# Patient Record
Sex: Female | Born: 1945 | Race: White | Hispanic: No | Smoking: Former smoker
Health system: Southern US, Community
[De-identification: ages and names within clinical notes are randomized; demographics above are authoritative.]

## PROBLEM LIST (undated history)

## (undated) DIAGNOSIS — I639 Cerebral infarction, unspecified: Secondary | ICD-10-CM

## (undated) DIAGNOSIS — M1710 Unilateral primary osteoarthritis, unspecified knee: Secondary | ICD-10-CM

## (undated) DIAGNOSIS — K219 Gastro-esophageal reflux disease without esophagitis: Secondary | ICD-10-CM

## (undated) DIAGNOSIS — G459 Transient cerebral ischemic attack, unspecified: Secondary | ICD-10-CM

## (undated) DIAGNOSIS — E782 Mixed hyperlipidemia: Secondary | ICD-10-CM

## (undated) DIAGNOSIS — E785 Hyperlipidemia, unspecified: Secondary | ICD-10-CM

## (undated) DIAGNOSIS — Z8619 Personal history of other infectious and parasitic diseases: Secondary | ICD-10-CM

## (undated) DIAGNOSIS — H25019 Cortical age-related cataract, unspecified eye: Secondary | ICD-10-CM

## (undated) DIAGNOSIS — IMO0002 Reserved for concepts with insufficient information to code with codable children: Secondary | ICD-10-CM

## (undated) DIAGNOSIS — J302 Other seasonal allergic rhinitis: Secondary | ICD-10-CM

## (undated) DIAGNOSIS — I10 Essential (primary) hypertension: Secondary | ICD-10-CM

## (undated) DIAGNOSIS — G5600 Carpal tunnel syndrome, unspecified upper limb: Secondary | ICD-10-CM

## (undated) DIAGNOSIS — D649 Anemia, unspecified: Secondary | ICD-10-CM

## (undated) HISTORY — PX: TOTAL ABDOMINAL HYSTERECTOMY: SHX209

## (undated) HISTORY — PX: EYE SURGERY: SHX253

## (undated) HISTORY — PX: SALPINGOOPHORECTOMY: SHX82

## (undated) HISTORY — PX: CATARACT EXTRACTION: SUR2

---

## 2003-10-30 ENCOUNTER — Ambulatory Visit: Payer: Self-pay | Admitting: Family Medicine

## 2003-12-04 ENCOUNTER — Ambulatory Visit: Payer: Self-pay | Admitting: General Practice

## 2004-06-22 ENCOUNTER — Other Ambulatory Visit: Payer: Self-pay

## 2004-06-23 ENCOUNTER — Ambulatory Visit: Payer: Self-pay | Admitting: Orthopaedic Surgery

## 2004-06-23 HISTORY — PX: KNEE ARTHROSCOPY W/ PARTIAL MEDIAL MENISCECTOMY: SHX1882

## 2004-08-05 ENCOUNTER — Encounter: Payer: Self-pay | Admitting: Orthopaedic Surgery

## 2004-11-17 ENCOUNTER — Ambulatory Visit: Payer: Self-pay | Admitting: Internal Medicine

## 2005-04-14 ENCOUNTER — Ambulatory Visit: Payer: Self-pay | Admitting: Internal Medicine

## 2006-04-25 ENCOUNTER — Ambulatory Visit: Payer: Self-pay | Admitting: Anesthesiology

## 2006-05-18 ENCOUNTER — Ambulatory Visit: Payer: Self-pay | Admitting: Anesthesiology

## 2006-06-20 ENCOUNTER — Ambulatory Visit: Payer: Self-pay | Admitting: Unknown Physician Specialty

## 2006-06-21 ENCOUNTER — Ambulatory Visit: Payer: Self-pay | Admitting: Unknown Physician Specialty

## 2006-07-20 ENCOUNTER — Ambulatory Visit: Payer: Self-pay | Admitting: Unknown Physician Specialty

## 2006-10-18 ENCOUNTER — Ambulatory Visit: Payer: Self-pay | Admitting: Internal Medicine

## 2008-06-27 ENCOUNTER — Ambulatory Visit: Payer: Self-pay

## 2009-09-30 ENCOUNTER — Ambulatory Visit: Payer: Self-pay

## 2010-03-15 ENCOUNTER — Ambulatory Visit: Payer: Self-pay | Admitting: Unknown Physician Specialty

## 2010-06-07 ENCOUNTER — Emergency Department (HOSPITAL_COMMUNITY): Payer: Medicare Other

## 2010-06-07 ENCOUNTER — Inpatient Hospital Stay (HOSPITAL_COMMUNITY)
Admission: EM | Admit: 2010-06-07 | Discharge: 2010-06-09 | DRG: 069 | Disposition: A | Discharge status: Home / Self Care | Payer: Medicare Other | Attending: Internal Medicine | Admitting: Internal Medicine

## 2010-06-07 DIAGNOSIS — E785 Hyperlipidemia, unspecified: Secondary | ICD-10-CM | POA: Diagnosis present

## 2010-06-07 DIAGNOSIS — G459 Transient cerebral ischemic attack, unspecified: Principal | ICD-10-CM | POA: Diagnosis present

## 2010-06-07 DIAGNOSIS — Z79899 Other long term (current) drug therapy: Secondary | ICD-10-CM

## 2010-06-07 DIAGNOSIS — M1710 Unilateral primary osteoarthritis, unspecified knee: Secondary | ICD-10-CM | POA: Diagnosis present

## 2010-06-07 DIAGNOSIS — I10 Essential (primary) hypertension: Secondary | ICD-10-CM | POA: Diagnosis present

## 2010-06-07 DIAGNOSIS — K219 Gastro-esophageal reflux disease without esophagitis: Secondary | ICD-10-CM | POA: Diagnosis present

## 2010-06-07 DIAGNOSIS — I6529 Occlusion and stenosis of unspecified carotid artery: Secondary | ICD-10-CM | POA: Diagnosis present

## 2010-06-07 DIAGNOSIS — Z780 Asymptomatic menopausal state: Secondary | ICD-10-CM

## 2010-06-07 DIAGNOSIS — Z79890 Hormone replacement therapy (postmenopausal): Secondary | ICD-10-CM

## 2010-06-07 LAB — POCT I-STAT, CHEM 8
BUN: 12 mg/dL (ref 6–23)
Calcium, Ion: 1.12 mmol/L (ref 1.12–1.32)
Chloride: 100 mEq/L (ref 96–112)
HCT: 40 % (ref 36.0–46.0)
Potassium: 4.1 mEq/L (ref 3.5–5.1)
Sodium: 134 mEq/L — ABNORMAL LOW (ref 135–145)

## 2010-06-07 LAB — URINALYSIS, ROUTINE W REFLEX MICROSCOPIC
Glucose, UA: NEGATIVE mg/dL
Hgb urine dipstick: NEGATIVE
Protein, ur: NEGATIVE mg/dL
Specific Gravity, Urine: 1.012 (ref 1.005–1.030)
Urobilinogen, UA: 0.2 mg/dL (ref 0.0–1.0)

## 2010-06-07 LAB — APTT: aPTT: 25 seconds (ref 24–37)

## 2010-06-07 LAB — PROTIME-INR
INR: 0.9 (ref 0.00–1.49)
Prothrombin Time: 12.4 seconds (ref 11.6–15.2)

## 2010-06-07 LAB — COMPREHENSIVE METABOLIC PANEL
AST: 25 U/L (ref 0–37)
Albumin: 3.3 g/dL — ABNORMAL LOW (ref 3.5–5.2)
Alkaline Phosphatase: 70 U/L (ref 39–117)
Chloride: 96 mEq/L (ref 96–112)
GFR calc Af Amer: >60 mL/min (ref >60)
Potassium: 4 mEq/L (ref 3.5–5.1)
Total Bilirubin: 0.3 mg/dL (ref 0.3–1.2)
Total Protein: 6.5 g/dL (ref 6.0–8.3)

## 2010-06-07 LAB — TSH: TSH: 1.397 u[IU]/mL (ref 0.350–4.500)

## 2010-06-07 LAB — PHOSPHORUS: Phosphorus: 3 mg/dL (ref 2.3–4.6)

## 2010-06-07 LAB — GLUCOSE, CAPILLARY: Glucose-Capillary: 117 mg/dL — ABNORMAL HIGH (ref 70–99)

## 2010-06-07 LAB — CBC
Platelets: 317 10*3/uL (ref 150–400)
RBC: 4.32 MIL/uL (ref 3.87–5.11)
RDW: 12.5 % (ref 11.5–15.5)
WBC: 6.1 10*3/uL (ref 4.0–10.5)

## 2010-06-07 LAB — MAGNESIUM: Magnesium: 1.9 mg/dL (ref 1.5–2.5)

## 2010-06-07 LAB — DIFFERENTIAL
Basophils Absolute: 0 10*3/uL (ref 0.0–0.1)
Basophils Relative: 1 % (ref 0–1)
Eosinophils Absolute: 0.2 10*3/uL (ref 0.0–0.7)
Eosinophils Relative: 3 % (ref 0–5)
Neutrophils Relative %: 45 % (ref 43–77)

## 2010-06-07 LAB — CK TOTAL AND CKMB (NOT AT ARMC)
CK, MB: 2.3 ng/mL (ref 0.3–4.0)
Total CK: 58 U/L (ref 7–177)

## 2010-06-08 LAB — URINE CULTURE
Colony Count: 15000
Culture  Setup Time: 201206031501

## 2010-06-08 LAB — CBC
HCT: 37.4 % (ref 36.0–46.0)
Hemoglobin: 12.5 g/dL (ref 12.0–15.0)
MCH: 28.6 pg (ref 26.0–34.0)
MCHC: 33.4 g/dL (ref 30.0–36.0)
RDW: 12.6 % (ref 11.5–15.5)

## 2010-06-08 LAB — BASIC METABOLIC PANEL
BUN: 9 mg/dL (ref 6–23)
CO2: 23 mEq/L (ref 19–32)
Calcium: 9.3 mg/dL (ref 8.4–10.5)
Creatinine, Ser: 0.55 mg/dL (ref 0.4–1.2)
GFR calc non Af Amer: >60 mL/min (ref >60)
Glucose, Bld: 161 mg/dL — ABNORMAL HIGH (ref 70–99)

## 2010-06-08 LAB — LIPID PANEL
Triglycerides: 230 mg/dL — ABNORMAL HIGH (ref <150)
VLDL: 46 mg/dL — ABNORMAL HIGH (ref 0–40)

## 2010-06-08 NOTE — Consult Note (Signed)
  NAMEKIRRAH, Marilyn [name] NO.:  [id number]  MEDICAL RECORD NO.:  [id number]  LOCATION:                                 FACILITY:  PHYSICIAN:  Levie Heritage, MD       DATE OF BIRTH:  [birth year]  DATE OF CONSULTATION:  06/07/2010 DATE OF DISCHARGE:                                CONSULTATION   REFERRING PHYSICIAN:  ER Team.  REASON FOR CONSULTATION:  Code stroke.  CHIEF COMPLAINT:  Sudden heaviness feeling of the left arm and leg.  HISTORY OF PRESENT ILLNESS:  This patient is a 65 year old woman who woke up this morning and went to restroom and while coming back she realized that she has been feeling heavy on the left arm and leg with altered sensation; however, she is affirmative that she could still move her left arm and leg appropriately, although it felt funny and heavy. She denies any changes in her vision.  Denies any change in her strength.  Denies any other neurological deficits.  She was brought up by the EMS and blood pressure in the field was noted to be in 200 systolic ranges.  Her symptoms resolved almost completely back to baseline while she was in CT scanner around 9:25 a.m.  The NIH stroke scale performed at that time is 0.  PAST MEDICAL HISTORY:  The patient is known to have the hypertension, hyperlipidemia, hysterectomy.  MEDICATIONS:  She takes generic for Cozaar 50 mg twice a day.  She takes Premarin 0.3 mg daily.  She takes Singulair 10 mg daily.  ALLERGIES:  She is allergic to PENICILLIN and SULFA DRUGS which cause rash.  SOCIAL HISTORY:  She is married and has one child.  She is a Buyer, retail.  She denies any tobacco, alcohol, or illicit drug abuse.  FAMILY HISTORY:  Father died in his 28s with stroke.  Mother had multiple TIAs but died of old age.  Dictated ended at this point.         ______________________________ Levie Heritage, MD    WS/MEDQ  D:  06/07/2010  T:  06/07/2010  Job:   [id number]  Electronically Signed by Levie Heritage MD on 06/08/2010 10:21:03 PM

## 2010-06-08 NOTE — Consult Note (Signed)
Marilyn [name], VALLELY                ACCOUNT NO.:  [id number]  MEDICAL RECORD NO.:  [id number]           PATIENT TYPE:  I  LOCATION:  3001                         FACILITY:  [facility]  PHYSICIAN:  Levie Heritage, MD       DATE OF BIRTH:  09/08/1945  DATE OF CONSULTATION:  06/07/2010 DATE OF DISCHARGE:                                CONSULTATION   REASON FOR CONSULTATION:  Code stroke.  REFERRING PHYSICIAN:  Emergency Department Team.  CHIEF COMPLAINT:  Sudden heaviness feeling of the left arm and leg.  HISTORY OF PRESENT ILLNESS:  This patient is a 65 year old woman who woke up normal this morning and around 8 and 8:15 a.m. she noted sudden left arm and left leg heaviness feeling.  She is affirmative that she could still move her left arm and leg appropriately.  However, it felt feeling heavy.  She denies any changes in her vision.  Denies any similar features in her face.  Denies any motor weakness otherwise.  She was taken to the hospital emergency by the EMS who reported having a systolic blood pressure in 200 ranges in field coming back.  While she was in the CT scanner, her symptoms completely resolved and NIH stroke scale at that time was 0.  PAST MEDICAL HISTORY:  Hysterectomy, hypertension, hyperlipidemia.  MEDICATIONS:  She takes; 1. Generic Cozaar 50 mg twice a day. 2. Premarin 0.3 mg daily for hot flashes. 3. Singulair 10 mg daily.  ALLERGIES:  She is allergic to PENICILLIN and SULFA drug which causes rash.  SOCIAL HISTORY:  She is married, has 1 child.  She is driver of a public transportation.  She denies any tobacco, alcohol or illicit drug abuse.  FAMILY HISTORY:  Father died of stroke in his 19s.  Mother had multiple TIAs.  REVIEW OF SYSTEM:  Denies any chest pain.  Denies any shortness of breath.  Denies any changes in her vision.  No recent rashes.  No recent fevers.  No recent weight loss.  No nausea, vomiting, diarrhea.  No burning urination.  No bowel or  bladder control problem.  No more motor weakness.  No sensory issues anymore.  No visual changes  Rest of 10- organ review of system unremarkable.  REVIEW OF CLINICAL DATA:  I have seen her CT scan in the emergency, although official report is still pending but I do notice rather subacute low-density area in the right anterior aspects of the basal ganglia.  It is not bigger than the lacune size.  Other than that I do not see any abnormality. Her stroke panel is unremarkable.  Her Chem-8 is showing mildly elevated glucose, but otherwise unremarkable.  PHYSICAL EXAMINATION:  GENERAL:  Currently lying down comfortably on the bed.  Awake, oriented x3 in no acute distress. VITAL SIGNS:  Blood pressure is 184/100 mmHg, pulse of 89 per minute. HEENT:  Bilateral pupils are reactive to light and accommodation.  No visual field cut.  Moves eyes to all direction.  Symmetrical facial sensation and strength testing.  Midline tongue without atrophy or fasciculation.  Motor examination strength reveals 5/5 all over  without any drift. Sensory examination, admits to light touch symmetrically normal all over. NIH stroke scale currently is 0.  IMPRESSION:  This patient is a 65 year old woman with sudden onset of the heavy feeling on the left arm and leg and the CT scan is showing low- density area on the left posterior frontal region.  I think its likley that  she does have a lacunar stroke in the left anterior basal ganglia causing her symptoms which are already resolving. This could also endup with a TIA if she  comes back completely at her baseline. However, her NIH stroke scale is 0 currently and that makes her not a candidate for TPA at this point.  PLAN: 1. Please give the patient aspirin 325 mg daily basis on first dose     now. 2. Get the MRI and MRA of the brain. 3. Doppler carotid, cardiac, echo, lipid profile in the morning     fasting labs. 4. Stroke Team will follow the patient up  from morning and will     proceed accordingly to the case progresses.          ______________________________ Levie Heritage, MD     WS/MEDQ  D:  06/07/2010  T:  06/07/2010  Job:  [id number]  Electronically Signed by Levie Heritage MD on 06/08/2010 08:15:38 AM

## 2010-06-09 NOTE — Discharge Summary (Signed)
NAMETABOR, BARTRAM NO.:  [id number]  MEDICAL RECORD NO.:  [id number]  LOCATION:  3001                         FACILITY:  [facility]  PHYSICIAN:  Talmage Nap, MD  DATE OF BIRTH:  26-Nov-1945  DATE OF ADMISSION:  06/07/2010 DATE OF DISCHARGE:  06/09/2010                        DISCHARGE SUMMARY - REFERRING   PRIMARY CARE PHYSICIAN:  Dr. Susette Racer, [place].  CONSULTANT INVOLVED IN THE CASE:  Neurology at [facility], Levie Heritage, MD  DISCHARGE DIAGNOSES: 1. Left-sided weakness, resolved, questionable transient ischemic     attack. 2. Hypertension. 3. Carotid artery disease - surveillance q.6-12 monthly. 4. Hyperlipidemia. 5. History of osteoarthritis. 6. Gastroesophageal reflux disease.  The patient is a 65 year old Caucasian female with history of GERD and hypertension, who was admitted to the hospital on June 07, 2010, by Dr. Rosanna Randy with complaint of left-sided weakness and numbness which started when the patient woke up from bed.  She says she was unable to move her left side and was dragging her left side.  She denied any history of blurry vision.  She says she had mild slurred speech, but no nausea, no vomiting, no fever, no chills, no rigor and subsequently was brought to the emergency room.  In the ED, code stroke was activated.  However, examination of the patient showed that symptoms had resolved.  The patient was subsequently admitted for the workup.  PAST SURGICAL HISTORY:  Hysterectomy.  PREADMISSION MEDICATIONS: 1. Premarin. 2. Singulair. 3. Diclofenac. 4. Nexium.  SOCIAL HISTORY:  Negative for alcohol or tobacco use.  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  FAMILY HISTORY:  Noncontributory.  REVIEW OF SYSTEMS:  Essentially documented in the initial history and physical.  PHYSICAL EXAMINATION:  VITAL SIGNS:  At the time the patient was seen by the admitting physician, her vital  signs; blood pressure was 184/100, respiratory rate 20, heart rate was 82, temperature was 98.5, and she was sating to be saturating 98% on room air. HEENT:  Pupils were reactive to light and extraocular muscles intact. NECK:  No jugular venous distention.  No carotid bruit.  No lymphadenopathy. CHEST:  Clear to auscultation. HEART:  Heart sounds are 1 and 2. ABDOMEN:  Soft, nontender.  Liver, spleen, kidney not palpable.  Bowel sounds are positive. EXTREMITIES:  No pedal edema. NEUROLOGIC:  Nonfocal. MUSCULOSKELETAL SYSTEM:  Unremarkable. SKIN:  Normal turgor.  LABORATORY DATA:  Chem-8 STAT showed a sodium of 134, potassium of 4.1, chloride of 102, bicarb of 114, BUN is 12, creatinine is 0.80. Hematological indices showed WBC of 6.1, hemoglobin of 12.4, hematocrit of 36.6, MCV of 84.7, platelet count of 317 with normal differentials. Urinalysis unremarkable.  Magnesium level is 1.9, phosphorus level 3.0. Hemoglobin A1c is 5.7.  TSH 1.397, and a complete blood count with no differential showed WBC of 7.3, hemoglobin of 12.5, hematocrit of 37.4, MCV of 85.6 with a platelet count of 344.  A repeat basic metabolic panel done on June 08, 2010, showed sodium of 130, potassium of 3.9, chloride of 93 with a bicarb of 23, glucose is 161, BUN is 9, creatinine 0.55.  Lipid panel showed total cholesterol 6.8, triglyceride 230,  HDL cholesterol 61, LDL cholesterol 161.  Urine culture negative.  Imaging studies done on the patient include CT of the head without contrast showed subacute chronic lacunar infarct in the right frontal lobe.  There is chronic small vessel disease.  MRI of the head without contrast showed no acute intracranial abnormality.  There is moderate for age chronic small vessel disease.  An MRA of the head showed no proximal stenosis or major intracranial branch occlusion.  There is mild atherosclerosis involving the anterior circulation.  Carotid duplex showed no significant  stenosis of the right ICA, but there is 60-79 stenosis of the left ICA.  A 2-D echo done showed normal left ventricular cavity, EF of 60%-65% with a PA pressure of 39 mmHg.  HOSPITAL COURSE:  The patient was admitted to Neuro Tele.  She was started on aspirin 325 mg p.o. daily, Premarin 0.3 mg p.o. daily, Singulair 10 mg p.o. daily, and diclofenac 75 mg p.o. b.i.d.  The patient was also started on normal saline IV to go at a rate of 100 mL an hour x8 hours and subsequently [facility].  She was given lisinopril 20 mg p.o. daily and Singulair 10 mg p.o. daily.  Also added to the patient's regimen was Zocor 20 mg p.o. daily.  The patient was evaluated by the in- house neurologist who basically concurred with the initial line of management.  Added to the patient's regimen was Zolpidem 5 mg p.o. at bedtime for insomnia.  The patient was, however, seen by me for the very first time in this admission on June 08, 2010, and during this encounter, examination was unremarkable and her vital signs were stable.  She was reevaluated by me today.  Examination was unremarkable.  Her vital signs; blood pressure 158/73, temperature is 98.1, pulse 79, respiratory rate 18, medically stable.  No neurological deficit.  Plan is for the patient to be discharged home today on activity as tolerated, low- sodium, low-cholesterol diet.  She will follow up with her primary care physician in 1-2 weeks and she was also advised to have a surveillance carotid duplex done every 6 months to 12 months.  Medications to be taken at home will include: 1. Aspirin 325 mg 1 p.o. daily. 2. Simvastatin 20 mg 1 p.o. daily. 3. Lopressor 50 mg p.o. b.i.d. 4. Budesonide 32 mcg 2 sprays daily. 5. Losartan 100 mg half a tablet p.o. b.i.d. 6. Multivitamin 1 p.o. daily. 7. Pantoprazole 40 mg 1 p.o. daily. 8. Premarin (conjugated estrogen USP) 0.3 mg 1 p.o. daily. 9. Singulair (montelukast) 10 mg 1 p.o. daily. 10.Voltaren (diclofenac sodium)  1 tablet p.o. b.i.d.     Talmage Nap, MD     CN/MEDQ  D:  06/09/2010  T:  06/09/2010  Job:  [id number]  cc:   Dr. Susette Racer  Electronically Signed by Talmage Nap  on 06/09/2010 05:17:29 PM

## 2010-06-25 NOTE — H&P (Signed)
Marilyn [name] NO.:  [id number]  MEDICAL RECORD NO.:  [id number]           PATIENT TYPE:  E  LOCATION:  [facility]                         FACILITY:  [facility]  PHYSICIAN:  Marilyn [name], MDDATE OF BIRTH:  April 01, 1945  DATE OF ADMISSION:  06/07/2010 DATE OF DISCHARGE:                             HISTORY & PHYSICAL   CHIEF COMPLAINT:  Left-sided weakness and numbness.  HISTORY OF PRESENT ILLNESS:  Ms. Marilyn [name] is a 65 year old female with past medical history significant for hypertension, gastroesophageal reflux disease, allergies, osteoarthritis, and a remote history of costochondritis, who came into the hospital complaining of left-sided weakness and numbness.  The patient reports that this morning when she woke up, she initially saw that she was having cramping on her left lower extremity.  At that moment, she tried to get out of bed and she was unable to really move her left side.  She was dragging her complete left side to the point that she actually stumbled and fall down with really hurting herself.  At that moment, she called her husband who talked with her noticing that it was a really mild slurred speech but not significant and due to the symptoms of being unable to move her left, called 911 and the patient was brought in for further evaluation and treatment.  In the emergency department, Code Stroke was activated. The patient had MRI, MRA, and also a CT scan of the head.  All of them unrevealing of any acute stroke and by the time that the MRI was finished, the patient's symptoms completely resolved making this whole episode appears to be as a TIA.  Of note, when the patient arrived to the emergency department, her blood pressure was 200 systolic and throughout the whole evaluation, it was 180 systolic over 100 diastolic. At that moment, Code Stroke was cancelled, [facility] was called to admit the patient in order to finish TIA workup and  provide further evaluation and treatment.  ALLERGIES:  The patient denies any known drug allergies.  PAST MEDICAL HISTORY:  Significant for gastroesophageal reflux disease, allergies, left knee pain secondary to osteoarthritis, hypertension, history of costochondritis.  The patient is postmenopausal, on HRT.  MEDICATIONS:  Medication reconciliation form has not been done at the moment of this dictation, but per the patient she uses Premarin, Singulair, diclofenac twice a day.  She uses Nexium on daily basis and she cannot remember what is the name of the medications that she use for her blood pressure.  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  She lives with her husband in [place].  Denies any tobacco, any alcohol, or any illicit drug use.  FAMILY HISTORY:  Noncontributory.  REVIEW OF SYSTEMS:  Essentially negative except as specified on the HPI. Denying chest pain, shortness of breath, nausea, vomiting, hematochezia, hematemesis, blurred vision, or any other complaints.  PHYSICAL EXAM:  VITAL SIGNS:  The patient's blood pressure 184/100, respiratory rate 20, heart rate 82, temperature 98.5, oxygen saturation 98% on room air. GENERAL:  The patient was lying in bed, in  no acute distress, alert, awake and oriented x3, cooperative to examination. HEENT:  No trauma.  Normocephalic.  Eyes, PERRLA.  Extraocular muscles intact.  No icterus.  No nystagmus.  Ears, nose, and throat completely normal without any exudates or any erythema inside her throat and no drainage or discharges from her ears and nose.  There is good dentition. Moist mucous membranes. NECK:  Supple.  No thyromegaly.  No bruits. RESPIRATORY:  Clear to auscultation bilaterally. HEART:  Regular rate and rhythm.  No murmurs, gallops, or rubs. ABDOMEN:  Soft, nontender, nondistended.  Positive bowel sounds. EXTREMITIES:  No cyanosis.  No  edema.  No clubbing. SKIN:  No rash.  No petechiae. NEUROLOGIC:  The patient was alert, awake and oriented x3.  Cranial nerves II through XII completely intact.  There is no uvula or tongue deviation.  Muscle strength 5/5 bilaterally symmetrically.  There is preserved intact light touch and pinprick sensation.  Intact fine motors movements.  Normal finger-to-nose and also heel-to-shin.  Negative Romberg.  Her gait was completely normal.  PERTINENT LABORATORY DATA:  The patient had an i-STAT chemistry that showed a bicarb of 25 with ionized calcium of 1.12, hemoglobin 13.6, sodium 134, potassium 4.1, chloride 100, glucose 114, BUN 12, creatinine 0.80, hematocrit was 40.0.  The patient had a stroke panel, which was essentially normal except for an albumin of 3.3.  Urinalysis was unremarkable.  Regarding imaging studies, the patient had a CT of the head without contrast that demonstrated subacute-to-chronic lacunar infarcts in the right frontal lobe, chronic small vessel disease.  MRI and MRA demonstrated no acute intracranial abnormality.  Moderate for age chronic small vessel disease.  No proximal stenosis or major intracranial branch occlusion.  Moderate-to-severe atherosclerosis involving the posterior circulation, especially the bilateral PCA branches.  Mild atherosclerosis involving the anterior circulation.  ASSESSMENT AND PLAN: 1. Left-sided weakness and numbness secondary to transient ischemic     attack.  At this moment, we are going to admit the patient to     complete workup having carotid Doppler, 2-D echo, and also a     stratification lab work looking TSH, fasting lipid profile.  The     patient is going to also have 24 hours observation on telemetry.     Her EKG is normal.  We are going to have a set of cardiac enzymes.     We are going to control the patient's blood pressure and we are     going to start her on aspirin on daily basis and also on statins.     The patient  reports that she was supposed to be taking statin, but     she stopped it because of the concern that she might develop     myalgias. 2. Hypertension.  At this moment, we are going to review the patient's     current home medications.  We are going to restart then leaving     some hypertensive margins due to the fact that we are in front of     an acute transient ischemic attack in order to guarantee blood     perfusion.  We are going to try to get the blood pressure into the     150s range as an outpatient.  We will be looking to have her blood     pressure below 140s.  The patient is going to follow a low-sodium     diet. 3. Gastroesophageal reflux disease.  We are going to  continue PPI. 4. Allergy.  We are going to continue Singulair. 5. Osteoarthritis.  We are going to use Tylenol.  At this moment, we     are going to discontinue the patient's     diclofenac that might be contributing to her hypertension and can     also be contributing to her gastroesophageal reflux.  For DVT     prophylaxis, we are going to start the patient on Lovenox.  Further     treatment are going to depends on the patient's evolution of her     symptoms and also results of the lab work.     Marilyn Randy, MD     CEM/MEDQ  D:  06/07/2010  T:  06/07/2010  Job:  [id number]  cc:   Dr. Daisy Lazar, [facility]  Electronically Signed by Vassie Loll MD on 06/25/2010 10:54:34 AM

## 2011-01-12 ENCOUNTER — Ambulatory Visit: Payer: Self-pay | Admitting: Internal Medicine

## 2011-04-27 ENCOUNTER — Ambulatory Visit: Payer: Self-pay | Admitting: Unknown Physician Specialty

## 2011-07-15 HISTORY — PX: OTHER SURGICAL HISTORY: SHX169

## 2011-08-09 ENCOUNTER — Encounter: Payer: Self-pay | Admitting: Physician Assistant

## 2011-09-05 ENCOUNTER — Encounter: Payer: Self-pay | Admitting: Physician Assistant

## 2011-12-10 ENCOUNTER — Emergency Department: Payer: Self-pay | Admitting: Emergency Medicine

## 2011-12-10 LAB — CBC WITH DIFFERENTIAL/PLATELET
Basophil #: 0.1 10*3/uL (ref 0.0–0.1)
Basophil %: 0.7 %
HCT: 34.3 % — ABNORMAL LOW (ref 35.0–47.0)
HGB: 11.6 g/dL — ABNORMAL LOW (ref 12.0–16.0)
Lymphocyte %: 23.8 %
Monocyte #: 0.8 x10 3/mm (ref 0.2–0.9)
Monocyte %: 8.3 %
Neutrophil #: 6.6 10*3/uL — ABNORMAL HIGH (ref 1.4–6.5)
Neutrophil %: 65 %
Platelet: 579 10*3/uL — ABNORMAL HIGH (ref 150–440)
RDW: 13.5 % (ref 11.5–14.5)

## 2011-12-10 LAB — URINALYSIS, COMPLETE
Bilirubin,UR: NEGATIVE
Blood: NEGATIVE
Cellular Cast: 1
Glucose,UR: 50 mg/dL (ref 0–75)
Ketone: NEGATIVE
Leukocyte Esterase: NEGATIVE
Nitrite: NEGATIVE
Ph: 5 (ref 4.5–8.0)
Squamous Epithelial: 1
WBC UR: 4 /HPF (ref 0–5)

## 2011-12-10 LAB — BASIC METABOLIC PANEL
BUN: 13 mg/dL (ref 7–18)
EGFR (African American): >60
EGFR (Non-African Amer.): >60
Glucose: 148 mg/dL — ABNORMAL HIGH (ref 65–99)
Osmolality: 251 (ref 275–301)
Potassium: 4.6 mmol/L (ref 3.5–5.1)
Sodium: 123 mmol/L — ABNORMAL LOW (ref 136–145)

## 2011-12-18 ENCOUNTER — Emergency Department: Payer: Self-pay | Admitting: Emergency Medicine

## 2012-05-24 ENCOUNTER — Ambulatory Visit: Payer: Self-pay | Admitting: Internal Medicine

## 2013-04-24 ENCOUNTER — Other Ambulatory Visit: Payer: Self-pay | Admitting: Unknown Physician Specialty

## 2013-04-24 DIAGNOSIS — K625 Hemorrhage of anus and rectum: Secondary | ICD-10-CM

## 2013-05-10 ENCOUNTER — Other Ambulatory Visit: Payer: Self-pay | Admitting: Unknown Physician Specialty

## 2013-05-10 DIAGNOSIS — Q438 Other specified congenital malformations of intestine: Secondary | ICD-10-CM

## 2013-05-10 DIAGNOSIS — K625 Hemorrhage of anus and rectum: Secondary | ICD-10-CM

## 2013-05-14 ENCOUNTER — Encounter (INDEPENDENT_AMBULATORY_CARE_PROVIDER_SITE_OTHER): Payer: Self-pay

## 2013-05-14 ENCOUNTER — Ambulatory Visit
Admission: RE | Admit: 2013-05-14 | Discharge: 2013-05-14 | Disposition: A | Discharge status: Home / Self Care | Payer: Medicare Other | Source: Ambulatory Visit | Attending: Unknown Physician Specialty | Admitting: Unknown Physician Specialty

## 2013-05-14 DIAGNOSIS — Q438 Other specified congenital malformations of intestine: Secondary | ICD-10-CM

## 2013-05-14 DIAGNOSIS — K625 Hemorrhage of anus and rectum: Secondary | ICD-10-CM

## 2013-09-19 ENCOUNTER — Ambulatory Visit: Payer: Self-pay | Admitting: Internal Medicine

## 2014-10-23 ENCOUNTER — Other Ambulatory Visit: Payer: Self-pay | Admitting: Obstetrics and Gynecology

## 2014-10-23 DIAGNOSIS — Z1231 Encounter for screening mammogram for malignant neoplasm of breast: Secondary | ICD-10-CM

## 2014-10-29 ENCOUNTER — Ambulatory Visit
Admission: RE | Admit: 2014-10-29 | Discharge: 2014-10-29 | Disposition: A | Discharge status: Home / Self Care | Payer: Medicare Other | Source: Ambulatory Visit | Attending: Obstetrics and Gynecology | Admitting: Obstetrics and Gynecology

## 2014-10-29 ENCOUNTER — Other Ambulatory Visit: Payer: Self-pay | Admitting: Obstetrics and Gynecology

## 2014-10-29 DIAGNOSIS — Z1231 Encounter for screening mammogram for malignant neoplasm of breast: Secondary | ICD-10-CM | POA: Diagnosis not present

## 2014-10-31 ENCOUNTER — Other Ambulatory Visit: Payer: Self-pay | Admitting: Obstetrics and Gynecology

## 2014-10-31 DIAGNOSIS — R921 Mammographic calcification found on diagnostic imaging of breast: Secondary | ICD-10-CM

## 2014-11-20 ENCOUNTER — Other Ambulatory Visit
Admission: RE | Admit: 2014-11-20 | Discharge: 2014-11-20 | Disposition: A | Discharge status: Home / Self Care | Payer: Medicare Other | Source: Ambulatory Visit | Attending: *Deleted | Admitting: *Deleted

## 2014-11-20 DIAGNOSIS — R197 Diarrhea, unspecified: Secondary | ICD-10-CM | POA: Insufficient documentation

## 2014-11-20 LAB — C DIFFICILE QUICK SCREEN W PCR REFLEX
C Diff antigen: NEGATIVE
C Diff interpretation: NEGATIVE
C Diff toxin: NEGATIVE

## 2014-11-26 ENCOUNTER — Encounter: Payer: Self-pay | Admitting: *Deleted

## 2014-11-26 LAB — MISC LABCORP TEST (SEND OUT): Labcorp test code: 123255

## 2014-11-26 LAB — GIARDIA, EIA; OVA/PARASITE: Giardia Ag, Stl: NEGATIVE

## 2014-11-26 LAB — LACTOFERRIN, FECAL, QUANT.: Lactoferrin, Fecal, Quant.: 86.4 ug/mL(g) — ABNORMAL HIGH (ref 0.00–7.24)

## 2014-11-26 LAB — O&P RESULT

## 2014-11-27 ENCOUNTER — Ambulatory Visit
Admission: RE | Admit: 2014-11-27 | Discharge: 2014-11-27 | Disposition: A | Discharge status: Home / Self Care | Payer: Medicare Other | Source: Ambulatory Visit | Attending: Unknown Physician Specialty | Admitting: Unknown Physician Specialty

## 2014-11-27 ENCOUNTER — Encounter
Admission: RE | Disposition: A | Discharge status: Home / Self Care | Payer: Self-pay | Source: Ambulatory Visit | Attending: Unknown Physician Specialty

## 2014-11-27 ENCOUNTER — Ambulatory Visit: Payer: Medicare Other | Admitting: Anesthesiology

## 2014-11-27 ENCOUNTER — Encounter: Payer: Self-pay | Admitting: *Deleted

## 2014-11-27 DIAGNOSIS — R195 Other fecal abnormalities: Secondary | ICD-10-CM | POA: Insufficient documentation

## 2014-11-27 DIAGNOSIS — K5090 Crohn's disease, unspecified, without complications: Secondary | ICD-10-CM | POA: Insufficient documentation

## 2014-11-27 DIAGNOSIS — Z803 Family history of malignant neoplasm of breast: Secondary | ICD-10-CM | POA: Diagnosis not present

## 2014-11-27 DIAGNOSIS — M1710 Unilateral primary osteoarthritis, unspecified knee: Secondary | ICD-10-CM | POA: Diagnosis not present

## 2014-11-27 DIAGNOSIS — Z7951 Long term (current) use of inhaled steroids: Secondary | ICD-10-CM | POA: Insufficient documentation

## 2014-11-27 DIAGNOSIS — Z8673 Personal history of transient ischemic attack (TIA), and cerebral infarction without residual deficits: Secondary | ICD-10-CM | POA: Diagnosis not present

## 2014-11-27 DIAGNOSIS — K640 First degree hemorrhoids: Secondary | ICD-10-CM | POA: Insufficient documentation

## 2014-11-27 DIAGNOSIS — Z87891 Personal history of nicotine dependence: Secondary | ICD-10-CM | POA: Insufficient documentation

## 2014-11-27 DIAGNOSIS — G5600 Carpal tunnel syndrome, unspecified upper limb: Secondary | ICD-10-CM | POA: Diagnosis not present

## 2014-11-27 DIAGNOSIS — R197 Diarrhea, unspecified: Secondary | ICD-10-CM | POA: Insufficient documentation

## 2014-11-27 DIAGNOSIS — K644 Residual hemorrhoidal skin tags: Secondary | ICD-10-CM | POA: Diagnosis not present

## 2014-11-27 DIAGNOSIS — Z9889 Other specified postprocedural states: Secondary | ICD-10-CM | POA: Insufficient documentation

## 2014-11-27 DIAGNOSIS — D509 Iron deficiency anemia, unspecified: Secondary | ICD-10-CM | POA: Diagnosis present

## 2014-11-27 DIAGNOSIS — Z9841 Cataract extraction status, right eye: Secondary | ICD-10-CM | POA: Insufficient documentation

## 2014-11-27 DIAGNOSIS — E782 Mixed hyperlipidemia: Secondary | ICD-10-CM | POA: Diagnosis not present

## 2014-11-27 DIAGNOSIS — I10 Essential (primary) hypertension: Secondary | ICD-10-CM | POA: Diagnosis not present

## 2014-11-27 DIAGNOSIS — K5289 Other specified noninfective gastroenteritis and colitis: Secondary | ICD-10-CM | POA: Insufficient documentation

## 2014-11-27 DIAGNOSIS — K219 Gastro-esophageal reflux disease without esophagitis: Secondary | ICD-10-CM | POA: Diagnosis not present

## 2014-11-27 DIAGNOSIS — Z90710 Acquired absence of both cervix and uterus: Secondary | ICD-10-CM | POA: Diagnosis not present

## 2014-11-27 DIAGNOSIS — Z7982 Long term (current) use of aspirin: Secondary | ICD-10-CM | POA: Insufficient documentation

## 2014-11-27 DIAGNOSIS — Z9842 Cataract extraction status, left eye: Secondary | ICD-10-CM | POA: Insufficient documentation

## 2014-11-27 DIAGNOSIS — Z79899 Other long term (current) drug therapy: Secondary | ICD-10-CM | POA: Insufficient documentation

## 2014-11-27 DIAGNOSIS — Z791 Long term (current) use of non-steroidal anti-inflammatories (NSAID): Secondary | ICD-10-CM | POA: Insufficient documentation

## 2014-11-27 DIAGNOSIS — D122 Benign neoplasm of ascending colon: Secondary | ICD-10-CM | POA: Insufficient documentation

## 2014-11-27 HISTORY — PX: COLONOSCOPY WITH PROPOFOL: SHX5780

## 2014-11-27 HISTORY — DX: Hyperlipidemia, unspecified: E78.5

## 2014-11-27 HISTORY — DX: Transient cerebral ischemic attack, unspecified: G45.9

## 2014-11-27 HISTORY — DX: Mixed hyperlipidemia: E78.2

## 2014-11-27 HISTORY — DX: Reserved for concepts with insufficient information to code with codable children: IMO0002

## 2014-11-27 HISTORY — DX: Personal history of other infectious and parasitic diseases: Z86.19

## 2014-11-27 HISTORY — DX: Anemia, unspecified: D64.9

## 2014-11-27 HISTORY — DX: Gastro-esophageal reflux disease without esophagitis: K21.9

## 2014-11-27 HISTORY — DX: Carpal tunnel syndrome, unspecified upper limb: G56.00

## 2014-11-27 HISTORY — DX: Other seasonal allergic rhinitis: J30.2

## 2014-11-27 HISTORY — DX: Cortical age-related cataract, unspecified eye: H25.019

## 2014-11-27 HISTORY — DX: Cerebral infarction, unspecified: I63.9

## 2014-11-27 HISTORY — DX: Unilateral primary osteoarthritis, unspecified knee: M17.10

## 2014-11-27 HISTORY — DX: Essential (primary) hypertension: I10

## 2014-11-27 SURGERY — COLONOSCOPY WITH PROPOFOL
Anesthesia: General

## 2014-11-27 MED ORDER — ONDANSETRON HCL 4 MG/2ML IJ SOLN
INTRAMUSCULAR | Status: DC | PRN
  Administered 2014-11-27: 4 mg via INTRAVENOUS

## 2014-11-27 MED ORDER — PROPOFOL 500 MG/50ML IV EMUL
INTRAVENOUS | Status: DC | PRN
  Administered 2014-11-27: 160 ug/kg/min via INTRAVENOUS

## 2014-11-27 MED ORDER — DEXAMETHASONE SODIUM PHOSPHATE 10 MG/ML IJ SOLN
INTRAMUSCULAR | Status: DC | PRN
  Administered 2014-11-27: 8 mg via INTRAVENOUS

## 2014-11-27 MED ORDER — LIDOCAINE HCL (CARDIAC) 20 MG/ML IV SOLN
INTRAVENOUS | Status: DC | PRN
  Administered 2014-11-27: 80 mg via INTRAVENOUS

## 2014-11-27 MED ORDER — SODIUM CHLORIDE 0.9 % IV SOLN: INTRAVENOUS | Status: DC

## 2014-11-27 MED ORDER — SODIUM CHLORIDE 0.9 % IV SOLN
INTRAVENOUS | Status: DC
  Administered 2014-11-27: 1000 mL via INTRAVENOUS

## 2014-11-27 NOTE — H&P (Signed)
Primary Care Physician:  Madelyn Brunner, MD Primary Gastroenterologist:  Dr. Vira Agar  Pre-Procedure History & Physical: HPI:  Marilyn [name] is a 69 y.o. female is here for an colonoscopy.   Past Medical History  Diagnosis Date  . Anemia   . Carpal tunnel syndrome   . Cataract cortical, senile   . Esophageal reflux   . Essential hypertension   . History of chicken pox   . Hyperlipidemia   . Mini stroke ([place])   . Mixed hyperlipidemia   . Osteoarthrosis, not specified whether generalized/localized, lower leg   . Seasonal allergies     Past Surgical History  Procedure Laterality Date  . Salpingoophorectomy    . Total abdominal hysterectomy    . Cataract extraction Bilateral     left 1989; right 1991  . Knee arthroscopy w/ partial medial meniscectomy Left 06/23/2004  . Makoplasty Left 07/15/2011  . Eye surgery      Prior to Admission medications   Medication Sig Start Date End Date Taking? Authorizing Provider  amLODipine (NORVASC) 2.5 MG tablet Take 2.5 mg by mouth daily.   Yes Historical Provider, MD  aspirin EC 81 MG tablet Take 81 mg by mouth daily.   Yes Historical Provider, MD  Dexlansoprazole 30 MG capsule Take 30 mg by mouth daily.   Yes Historical Provider, MD  diclofenac (VOLTAREN) 75 MG EC tablet Take 75 mg by mouth 2 (two) times daily.   Yes Historical Provider, MD  diphenhydramine-acetaminophen (TYLENOL PM) 25-500 MG TABS tablet Take 1 tablet by mouth at bedtime as needed.   Yes Historical Provider, MD  fluticasone (FLONASE) 50 MCG/ACT nasal spray Place into both nostrils daily.   Yes Historical Provider, MD  losartan (COZAAR) 50 MG tablet Take 50 mg by mouth 2 (two) times daily.   Yes Historical Provider, MD  montelukast (SINGULAIR) 10 MG tablet Take 10 mg by mouth at bedtime.   Yes Historical Provider, MD  Multiple Vitamins-Minerals (MULTIVITAMIN ADULT PO) Take by mouth.   Yes Historical Provider, MD  Bismuth Subsalicylate (PEPTO-BISMOL MAX STRENGTH) 525  MG/15ML SUSP Take 15 mLs by mouth daily as needed.    Historical Provider, MD  ciprofloxacin (CIPRO) 500 MG tablet Take 500 mg by mouth 2 (two) times daily.    Historical Provider, MD  clobetasol cream (TEMOVATE) [id number] % Apply 1 application topically daily.    Historical Provider, MD  hydrOXYzine (ATARAX/VISTARIL) 25 MG tablet Take 25 mg by mouth 3 (three) times daily as needed for itching.    Historical Provider, MD  saccharomyces boulardii (FLORASTOR) 250 MG capsule Take 250 mg by mouth 2 (two) times daily.    Historical Provider, MD  simvastatin (ZOCOR) 20 MG tablet Take 20 mg by mouth at bedtime.    Historical Provider, MD    Allergies as of 11/21/2014  . (Not on File)    Family History  Problem Relation Age of Onset  . Breast cancer Paternal Aunt     Social History   Social History  . Marital Status: Married    Spouse Name: N/A  . Number of Children: N/A  . Years of Education: N/A   Occupational History  . Not on file.   Social History Main Topics  . Smoking status: Former Research scientist (life sciences)  . Smokeless tobacco: Not on file  . Alcohol Use: Not on file  . Drug Use: Not on file  . Sexual Activity: Not on file   Other Topics Concern  . Not on file  Social History Narrative    Review of Systems: See HPI, otherwise negative ROS  Physical Exam: BP 129/48 mmHg  Pulse 67  Temp(Src) 98.1 F (36.7 C) (Oral)  Resp 18  Ht 5\' 2"  (1.575 m)  Wt 74.844 kg (165 lb)  BMI 30.17 kg/m2  SpO2 97% General:   Alert,  pleasant and cooperative in NAD Head:  Normocephalic and atraumatic. Neck:  Supple; no masses or thyromegaly. Lungs:  Clear throughout to auscultation.    Heart:  Regular rate and rhythm. Abdomen:  Soft, nontender and nondistended. Normal bowel sounds, without guarding, and without rebound.   Neurologic:  Alert and  oriented x4;  grossly normal neurologically.  Impression/Plan: Marilyn [name] is here for an colonoscopy to be performed for iron def anemia, heme positive  stool.  Risks, benefits, limitations, and alternatives regarding  colonoscopy have been reviewed with the patient.  Questions have been answered.  All parties agreeable.   Gaylyn Cheers, MD  11/27/2014, 8:05 AM

## 2014-11-27 NOTE — Op Note (Signed)
[facility] Gastroenterology Patient Name: Marilyn [name] Procedure Date: 11/27/2014 8:11 AM MRN: [id number] Account #: [id number] Date of Birth: 1945/10/02 Admit Type: Outpatient Age: 69 Room: [facility] ENDO ROOM 4 Gender: Female Note Status: Finalized Procedure:         Colonoscopy Indications:       Heme positive stool, Iron deficiency anemia secondary to                     chronic blood loss, Iron deficiency anemia Providers:         Manya Silvas, MD Referring MD:      Hewitt Blade. Sarina Ser, MD (Referring MD) Medicines:         Propofol per Anesthesia Complications:     No immediate complications. Procedure:         Pre-Anesthesia Assessment:                    - After reviewing the risks and benefits, the patient was                     deemed in satisfactory condition to undergo the procedure.                    After obtaining informed consent, the colonoscope was                     passed under direct vision. Throughout the procedure, the                     patient's blood pressure, pulse, and oxygen saturations                     were monitored continuously. The Colonoscope was                     introduced through the anus and advanced to the the                     terminal ileum. The colonoscopy was somewhat difficult due                     to a tortuous colon. Successful completion of the                     procedure was aided by applying abdominal pressure. The                     patient tolerated the procedure well. The quality of the                     bowel preparation was excellent. Findings:      A diminutive polyp was found in the ascending colon. The polyp was       sessile. The polyp was removed with a jumbo cold forceps. Resection and       retrieval were complete.      Diffuse and patchy moderate inflammation characterized by erosions,       erythema, granularity and shallow ulcerations was found in the proximal       ascending  colon, in the mid ascending colon and in the distal ascending       colon. Biopsies were taken with a cold forceps for histology. Areas       inbetween showed normal colon mucosa.  Nonbleeding ulcerated mucosa with no stigmata of recent bleeding were       present in the descending colon. Biopsies were taken with a cold forceps       for histology.      External and internal hemorrhoids were found during endoscopy. The       hemorrhoids were medium-sized and Grade I (internal hemorrhoids that do       not prolapse).      Patient had hiccups which started during the procedure and stopped when       propofol was stopped and restarted when the propofol was restarted. Impression:        - One diminutive polyp in the ascending colon. Resected                     and retrieved.                    - Diffuse and patchy moderate inflammation was found in                     the proximal ascending colon, in the mid ascending colon                     and in the distal ascending colon secondary to Crohn's                     disease. Biopsied.                    - Mucosal ulceration. Biopsied.                    - External and internal hemorrhoids. Recommendation:    - Await pathology results. Probable Crohn's disease Manya Silvas, MD 11/27/2014 8:59:25 AM This report has been signed electronically. Number of Addenda: 0 Note Initiated On: 11/27/2014 8:11 AM Scope Withdrawal Time: 0 hours 9 minutes 23 seconds  Total Procedure Duration: 0 hours 28 minutes 9 seconds       [facility]

## 2014-11-27 NOTE — Anesthesia Postprocedure Evaluation (Signed)
Anesthesia Post Note  Patient: Marilyn [name]  Procedure(s) Performed: Procedure(s) (LRB): COLONOSCOPY WITH PROPOFOL (N/A)  Patient location during evaluation: PACU Anesthesia Type: General Level of consciousness: awake and alert Pain management: pain level controlled Vital Signs Assessment: post-procedure vital signs reviewed and stable Respiratory status: spontaneous breathing, nonlabored ventilation, respiratory function stable and patient connected to nasal cannula oxygen Cardiovascular status: blood pressure returned to baseline and stable Postop Assessment: No signs of nausea or vomiting Anesthetic complications: no    Last Vitals:  Filed Vitals:   11/27/14 0913 11/27/14 0923  BP: 129/50 105/57  Pulse: 71 69  Temp:    Resp: 21 26    Last Pain: There were no vitals filed for this visit.               Martha Clan

## 2014-11-27 NOTE — Transfer of Care (Signed)
Immediate Anesthesia Transfer of Care Note  Patient: Marilyn [name]  Procedure(s) Performed: Procedure(s): COLONOSCOPY WITH PROPOFOL (N/A)  Patient Location: PACU and Endoscopy Unit  Anesthesia Type:General  Level of Consciousness: awake, alert  and oriented  Airway & Oxygen Therapy: Patient Spontanous Breathing and Patient connected to nasal cannula oxygen  Post-op Assessment: Report given to RN and Post -op Vital signs reviewed and stable  Post vital signs: Reviewed and stable  Last Vitals: 8:54 71 hr 100% sat 18resp 96.7 130/57 bp Filed Vitals:   11/27/14 0743  BP: 129/48  Pulse: 67  Temp: 36.7 C  Resp: 18    Complications: No apparent anesthesia complications

## 2014-11-27 NOTE — Anesthesia Preprocedure Evaluation (Addendum)
Anesthesia Evaluation  Patient identified by MRN, date of birth, ID band Patient awake    Reviewed: Allergy & Precautions, H&P , NPO status , Patient's Chart, lab work & pertinent test results, reviewed documented beta blocker date and time   History of Anesthesia Complications Negative for: history of anesthetic complications  Airway Mallampati: I  TM Distance: >3 FB Neck ROM: full    Dental no notable dental hx. 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:   Pulmonary neg shortness of breath, neg sleep apnea, neg COPD, neg recent URI, former smoker,    Pulmonary exam normal breath sounds clear to auscultation       Cardiovascular Exercise Tolerance: Good hypertension, On Medications (-) angina(-) CAD, (-) Past MI, (-) Cardiac Stents and (-) CABG Normal cardiovascular exam(-) dysrhythmias + Valvular Problems/Murmurs  Rhythm:regular Rate:Normal     Neuro/Psych neg Seizures TIA Neuromuscular disease (carpal tunnel) negative psych ROS   GI/Hepatic GERD  Medicated and Controlled,Fatty liver disease   Endo/Other  negative endocrine ROS  Renal/GU negative Renal ROS  negative genitourinary   Musculoskeletal   Abdominal   Peds  Hematology  (+) Blood dyscrasia, anemia ,   Anesthesia Other Findings Past Medical History:   Anemia                                                       Carpal tunnel syndrome                                       Cataract cortical, senile                                    Esophageal reflux                                            Essential hypertension                                       History of chicken pox                                       Hyperlipidemia                                               Mini stroke (HCC)                                            Mixed hyperlipidemia  Osteoarthrosis, not specified whether  generali*              Seasonal allergies                                           Reproductive/Obstetrics negative OB ROS                            Anesthesia Physical Anesthesia Plan  ASA: II  Anesthesia Plan: General   Post-op Pain Management:    Induction:   Airway Management Planned:   Additional Equipment:   Intra-op Plan:   Post-operative Plan:   Informed Consent: I have reviewed the patients History and Physical, chart, labs and discussed the procedure including the risks, benefits and alternatives for the proposed anesthesia with the patient or authorized representative who has indicated his/her understanding and acceptance.   Dental Advisory Given  Plan Discussed with: Anesthesiologist, CRNA and Surgeon  Anesthesia Plan Comments:        Anesthesia Quick Evaluation

## 2014-12-01 LAB — SURGICAL PATHOLOGY

## 2014-12-02 ENCOUNTER — Encounter: Payer: Self-pay | Admitting: Unknown Physician Specialty

## 2014-12-04 ENCOUNTER — Ambulatory Visit
Admission: RE | Admit: 2014-12-04 | Discharge: 2014-12-04 | Disposition: A | Discharge status: Home / Self Care | Payer: Medicare Other | Source: Ambulatory Visit | Attending: Obstetrics and Gynecology | Admitting: Obstetrics and Gynecology

## 2014-12-04 DIAGNOSIS — R921 Mammographic calcification found on diagnostic imaging of breast: Secondary | ICD-10-CM | POA: Insufficient documentation

## 2014-12-10 ENCOUNTER — Ambulatory Visit: Payer: Medicare Other

## 2014-12-10 ENCOUNTER — Other Ambulatory Visit: Payer: Medicare Other

## 2014-12-30 LAB — OVA + PARASITE EXAM

## 2015-05-27 ENCOUNTER — Other Ambulatory Visit: Payer: Self-pay | Admitting: Internal Medicine

## 2015-05-27 DIAGNOSIS — R928 Other abnormal and inconclusive findings on diagnostic imaging of breast: Secondary | ICD-10-CM

## 2015-06-06 ENCOUNTER — Ambulatory Visit
Admission: RE | Admit: 2015-06-06 | Discharge: 2015-06-06 | Disposition: A | Discharge status: Home / Self Care | Payer: Medicare Other | Source: Ambulatory Visit | Attending: Internal Medicine | Admitting: Internal Medicine

## 2015-06-06 DIAGNOSIS — R921 Mammographic calcification found on diagnostic imaging of breast: Secondary | ICD-10-CM | POA: Diagnosis not present

## 2015-06-06 DIAGNOSIS — R928 Other abnormal and inconclusive findings on diagnostic imaging of breast: Secondary | ICD-10-CM

## 2015-07-18 ENCOUNTER — Other Ambulatory Visit: Payer: Self-pay | Admitting: Ophthalmology

## 2015-07-18 DIAGNOSIS — H532 Diplopia: Secondary | ICD-10-CM

## 2015-07-24 ENCOUNTER — Ambulatory Visit
Admission: RE | Admit: 2015-07-24 | Discharge: 2015-07-24 | Disposition: A | Discharge status: Home / Self Care | Payer: Medicare Other | Source: Ambulatory Visit | Attending: Ophthalmology | Admitting: Ophthalmology

## 2015-07-24 DIAGNOSIS — I6782 Cerebral ischemia: Secondary | ICD-10-CM | POA: Diagnosis not present

## 2015-07-24 DIAGNOSIS — H5010 Unspecified exotropia: Secondary | ICD-10-CM | POA: Diagnosis present

## 2015-07-24 DIAGNOSIS — H532 Diplopia: Secondary | ICD-10-CM | POA: Insufficient documentation

## 2015-07-24 DIAGNOSIS — G9389 Other specified disorders of brain: Secondary | ICD-10-CM | POA: Diagnosis not present

## 2015-07-24 DIAGNOSIS — R51 Headache: Secondary | ICD-10-CM | POA: Diagnosis present

## 2015-07-24 LAB — POCT I-STAT CREATININE: CREATININE: 0.7 mg/dL (ref 0.44–1.00)

## 2015-07-24 MED ORDER — GADOBENATE DIMEGLUMINE 529 MG/ML IV SOLN
20.0000 mL | Freq: Once | INTRAVENOUS | Status: AC | PRN
  Administered 2015-07-24: 15 mL via INTRAVENOUS

## 2016-02-19 ENCOUNTER — Other Ambulatory Visit: Payer: Self-pay | Admitting: Obstetrics and Gynecology

## 2016-02-19 DIAGNOSIS — R921 Mammographic calcification found on diagnostic imaging of breast: Secondary | ICD-10-CM

## 2016-03-19 ENCOUNTER — Ambulatory Visit
Admission: RE | Admit: 2016-03-19 | Discharge: 2016-03-19 | Disposition: A | Discharge status: Home / Self Care | Payer: Medicare Other | Source: Ambulatory Visit | Attending: Obstetrics and Gynecology | Admitting: Obstetrics and Gynecology

## 2016-03-19 DIAGNOSIS — R921 Mammographic calcification found on diagnostic imaging of breast: Secondary | ICD-10-CM | POA: Insufficient documentation

## 2016-05-10 ENCOUNTER — Ambulatory Visit (INDEPENDENT_AMBULATORY_CARE_PROVIDER_SITE_OTHER): Payer: Medicare Other

## 2016-05-10 ENCOUNTER — Ambulatory Visit (INDEPENDENT_AMBULATORY_CARE_PROVIDER_SITE_OTHER): Payer: Medicare Other | Admitting: Podiatry

## 2016-05-10 ENCOUNTER — Encounter: Payer: Self-pay | Admitting: Podiatry

## 2016-05-10 DIAGNOSIS — M779 Enthesopathy, unspecified: Secondary | ICD-10-CM | POA: Diagnosis not present

## 2016-05-10 DIAGNOSIS — M778 Other enthesopathies, not elsewhere classified: Secondary | ICD-10-CM

## 2016-05-10 DIAGNOSIS — M7750 Other enthesopathy of unspecified foot: Secondary | ICD-10-CM

## 2016-05-10 DIAGNOSIS — M79672 Pain in left foot: Principal | ICD-10-CM

## 2016-05-10 DIAGNOSIS — M79671 Pain in right foot: Secondary | ICD-10-CM

## 2016-05-10 MED ORDER — TRIAMCINOLONE ACETONIDE 10 MG/ML IJ SUSP
10.0000 mg | Freq: Once | INTRAMUSCULAR | Status: AC
  Administered 2016-05-10: 10 mg

## 2016-05-12 NOTE — Progress Notes (Signed)
Subjective:    Patient ID: Marilyn [name], female   DOB: 71 y.o.   MRN: [id number]   HPI patient presents stating she's had a lot of pain in her right over left foot and she's tried walk differently and tried other modalities without relief and it's gradually becoming more painful and she did have previous treatment at another physician that she was not satisfied with and it's been present for around 6 months    Review of Systems  All other systems reviewed and are negative.       Objective:  Physical Exam  Cardiovascular: Intact distal pulses.   Musculoskeletal: Normal range of motion.  Neurological: She is alert.  Skin: Skin is warm.  Nursing note and vitals reviewed.  neurovascular status intact muscle strength was adequate range of motion within normal limits with exquisite discomfort second MPJ right with inflammation fluid around the joint that's localized in nature. Patient's found have good digital perfusion and is well oriented 3     Assessment:  Inflammatory capsulitis second MPJ right with fluid and mild to moderate on the left       Plan:    H&P condition and x-rays reviewed with patient. At this point I did do a proximal nerve block I was able to aspirate the joint getting out a small amount of clear fluid and injected with quarter cc deck some some Kenalog and applied thick plantar padding to reduce stress against the joint surface. Discussed long-term orthotics depending on response and will wear rigid bottom shoes at this time  X-ray indicates no signs of stress fracture or arthritic process

## 2016-05-20 ENCOUNTER — Encounter: Payer: Self-pay | Admitting: *Deleted

## 2016-05-21 ENCOUNTER — Encounter
Admission: RE | Disposition: A | Discharge status: Home / Self Care | Payer: Self-pay | Source: Ambulatory Visit | Attending: Unknown Physician Specialty

## 2016-05-21 ENCOUNTER — Ambulatory Visit
Admission: RE | Admit: 2016-05-21 | Discharge: 2016-05-21 | Disposition: A | Discharge status: Home / Self Care | Payer: Medicare Other | Source: Ambulatory Visit | Attending: Unknown Physician Specialty | Admitting: Unknown Physician Specialty

## 2016-05-21 ENCOUNTER — Ambulatory Visit: Payer: Medicare Other | Admitting: Anesthesiology

## 2016-05-21 ENCOUNTER — Encounter: Payer: Self-pay | Admitting: *Deleted

## 2016-05-21 DIAGNOSIS — K562 Volvulus: Secondary | ICD-10-CM | POA: Insufficient documentation

## 2016-05-21 DIAGNOSIS — K52831 Collagenous colitis: Secondary | ICD-10-CM | POA: Insufficient documentation

## 2016-05-21 DIAGNOSIS — G5600 Carpal tunnel syndrome, unspecified upper limb: Secondary | ICD-10-CM | POA: Diagnosis not present

## 2016-05-21 DIAGNOSIS — D649 Anemia, unspecified: Secondary | ICD-10-CM | POA: Insufficient documentation

## 2016-05-21 DIAGNOSIS — K640 First degree hemorrhoids: Secondary | ICD-10-CM | POA: Diagnosis not present

## 2016-05-21 DIAGNOSIS — D120 Benign neoplasm of cecum: Secondary | ICD-10-CM | POA: Diagnosis not present

## 2016-05-21 DIAGNOSIS — K621 Rectal polyp: Secondary | ICD-10-CM | POA: Diagnosis not present

## 2016-05-21 DIAGNOSIS — M1990 Unspecified osteoarthritis, unspecified site: Secondary | ICD-10-CM | POA: Insufficient documentation

## 2016-05-21 DIAGNOSIS — E782 Mixed hyperlipidemia: Secondary | ICD-10-CM | POA: Diagnosis not present

## 2016-05-21 DIAGNOSIS — K219 Gastro-esophageal reflux disease without esophagitis: Secondary | ICD-10-CM | POA: Insufficient documentation

## 2016-05-21 DIAGNOSIS — Z8673 Personal history of transient ischemic attack (TIA), and cerebral infarction without residual deficits: Secondary | ICD-10-CM | POA: Diagnosis not present

## 2016-05-21 DIAGNOSIS — K5730 Diverticulosis of large intestine without perforation or abscess without bleeding: Secondary | ICD-10-CM | POA: Diagnosis not present

## 2016-05-21 DIAGNOSIS — G709 Myoneural disorder, unspecified: Secondary | ICD-10-CM | POA: Insufficient documentation

## 2016-05-21 DIAGNOSIS — Z87891 Personal history of nicotine dependence: Secondary | ICD-10-CM | POA: Insufficient documentation

## 2016-05-21 DIAGNOSIS — K760 Fatty (change of) liver, not elsewhere classified: Secondary | ICD-10-CM | POA: Diagnosis not present

## 2016-05-21 DIAGNOSIS — I10 Essential (primary) hypertension: Secondary | ICD-10-CM | POA: Insufficient documentation

## 2016-05-21 DIAGNOSIS — Z79899 Other long term (current) drug therapy: Secondary | ICD-10-CM | POA: Diagnosis not present

## 2016-05-21 HISTORY — PX: COLONOSCOPY WITH PROPOFOL: SHX5780

## 2016-05-21 SURGERY — COLONOSCOPY WITH PROPOFOL
Anesthesia: General

## 2016-05-21 MED ORDER — PROPOFOL 10 MG/ML IV BOLUS
INTRAVENOUS | Status: AC
Start: 2016-05-21 — End: 2016-05-21
  Filled 2016-05-21: qty 20

## 2016-05-21 MED ORDER — SODIUM CHLORIDE 0.9 % IV SOLN
INTRAVENOUS | Status: DC
  Administered 2016-05-21: 1000 mL via INTRAVENOUS

## 2016-05-21 MED ORDER — PROPOFOL 10 MG/ML IV BOLUS
INTRAVENOUS | Status: DC | PRN
  Administered 2016-05-21: 80 mg via INTRAVENOUS

## 2016-05-21 MED ORDER — PROPOFOL 500 MG/50ML IV EMUL
INTRAVENOUS | Status: DC | PRN
  Administered 2016-05-21: 100 ug/kg/min via INTRAVENOUS

## 2016-05-21 MED ORDER — PROPOFOL 10 MG/ML IV BOLUS
INTRAVENOUS | Status: AC
  Filled 2016-05-21: qty 20

## 2016-05-21 NOTE — Anesthesia Preprocedure Evaluation (Addendum)
Anesthesia Evaluation  Patient identified by MRN, date of birth, ID band Patient awake    Reviewed: Allergy & Precautions, H&P , NPO status , Patient's Chart, lab work & pertinent test results, reviewed documented beta blocker date and time   History of Anesthesia Complications Negative for: history of anesthetic complications  Airway Mallampati: II  TM Distance: <3 FB Neck ROM: full    Dental no notable dental hx. 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 on medications (-) angina(-) CAD, (-) Past MI, (-) Cardiac Stents and (-) CABG Normal cardiovascular exam(-) dysrhythmias + Valvular Problems/Murmurs  Rhythm:regular Rate:Normal     Neuro/Psych neg Seizures TIA Neuromuscular disease negative psych ROS   GI/Hepatic GERD  Medicated and Controlled,Fatty liver disease   Endo/Other  negative endocrine ROS  Renal/GU negative Renal ROS  negative genitourinary   Musculoskeletal  (+) Arthritis , Osteoarthritis,    Abdominal   Peds negative pediatric ROS (+)  Hematology  (+) Blood dyscrasia, anemia ,   Anesthesia Other Findings Past Medical History:   Anemia                                                       Carpal tunnel syndrome                                       Cataract cortical, senile                                    Esophageal reflux                                            Essential hypertension                                       History of chicken pox                                       Hyperlipidemia                                               Mini stroke (HCC)                                            Mixed hyperlipidemia  Osteoarthrosis, not specified whether generali*              Seasonal allergies                                           Reproductive/Obstetrics negative OB ROS                            Anesthesia Physical  Anesthesia Plan  ASA: III  Anesthesia Plan: General   Post-op Pain Management:    Induction: Intravenous  Airway Management Planned: Nasal Cannula  Additional Equipment:   Intra-op Plan:   Post-operative Plan:   Informed Consent: I have reviewed the patients History and Physical, chart, labs and discussed the procedure including the risks, benefits and alternatives for the proposed anesthesia with the patient or authorized representative who has indicated his/her understanding and acceptance.   Dental Advisory Given  Plan Discussed with: Anesthesiologist, CRNA and Surgeon  Anesthesia Plan Comments:         Anesthesia Quick Evaluation

## 2016-05-21 NOTE — Anesthesia Postprocedure Evaluation (Signed)
Anesthesia Post Note  Patient: Marilyn [name]  Procedure(s) Performed: Procedure(s) (LRB): COLONOSCOPY WITH PROPOFOL (N/A)  Patient location during evaluation: PACU Anesthesia Type: General Level of consciousness: awake and alert and oriented Pain management: pain level controlled Vital Signs Assessment: post-procedure vital signs reviewed and stable Respiratory status: spontaneous breathing Cardiovascular status: blood pressure returned to baseline Anesthetic complications: no     Last Vitals:  Vitals:   05/21/16 1417 05/21/16 1427  BP: (!) 139/59 (!) 147/59  Pulse: 68 69  Resp: 16 16  Temp:      Last Pain:  Vitals:   05/21/16 1357  TempSrc: Tympanic                 Katielynn Horan

## 2016-05-21 NOTE — Transfer of Care (Signed)
Immediate Anesthesia Transfer of Care Note  Patient: Marilyn [name]  Procedure(s) Performed: Procedure(s): COLONOSCOPY WITH PROPOFOL (N/A)  Patient Location: PACU and Endoscopy Unit  Anesthesia Type:General  Level of Consciousness: oriented, drowsy and patient cooperative  Airway & Oxygen Therapy: Patient Spontanous Breathing and Patient connected to nasal cannula oxygen  Post-op Assessment: Report given to RN and Post -op Vital signs reviewed and stable  Post vital signs: Reviewed and stable  Last Vitals:  Vitals:   05/21/16 1229 05/21/16 1357  BP: (!) 146/72 (!) 118/52  Pulse: 71 72  Resp: 18 16  Temp: 36.4 C (!) 36.1 C    Last Pain:  Vitals:   05/21/16 1357  TempSrc: Tympanic         Complications: No apparent anesthesia complications

## 2016-05-21 NOTE — H&P (Signed)
Primary Care Physician:  Madelyn Brunner, MD Primary Gastroenterologist:  Dr. Vira Agar  Pre-Procedure History & Physical: HPI:  Marilyn [name] is a 71 y.o. female is here for an colonoscopy.   Past Medical History:  Diagnosis Date  . Anemia   . Carpal tunnel syndrome   . Cataract cortical, senile   . Esophageal reflux   . Essential hypertension   . History of chicken pox   . Hyperlipidemia   . Mini stroke ([place])   . Mixed hyperlipidemia   . Osteoarthrosis, not specified whether generalized/localized, lower leg   . Seasonal allergies     Past Surgical History:  Procedure Laterality Date  . CATARACT EXTRACTION Bilateral    left 1989; right 1991  . COLONOSCOPY WITH PROPOFOL N/A 11/27/2014   Procedure: COLONOSCOPY WITH PROPOFOL;  Surgeon: Manya Silvas, MD;  Location: [facility] ENDOSCOPY;  Service: Endoscopy;  Laterality: N/A;  . EYE SURGERY    . KNEE ARTHROSCOPY W/ PARTIAL MEDIAL MENISCECTOMY Left 06/23/2004  . Makoplasty Left 07/15/2011  . SALPINGOOPHORECTOMY    . TOTAL ABDOMINAL HYSTERECTOMY      Prior to Admission medications   Medication Sig Start Date End Date Taking? Authorizing Provider  amLODipine (NORVASC) 2.5 MG tablet Take 2.5 mg by mouth daily.   Yes [provider]  budesonide (ENTOCORT EC) 3 MG 24 hr capsule Take by mouth. 01/23/16 01/22/17 Yes [provider]  losartan (COZAAR) 50 MG tablet Take 50 mg by mouth 2 (two) times daily.   Yes [provider]  diphenhydramine-acetaminophen (TYLENOL PM) 25-500 MG TABS tablet Take 1 tablet by mouth at bedtime as needed.    [provider]  fluticasone (FLONASE) 50 MCG/ACT nasal spray Place into both nostrils daily.    [provider]  montelukast (SINGULAIR) 10 MG tablet Take 10 mg by mouth at bedtime.    [provider]  Multiple Vitamins-Minerals (MULTIVITAMIN ADULT PO) Take by mouth.    [provider]    Allergies as of 02/27/2016 - Review Complete  11/27/2014  Allergen Reaction Noted  . Amitriptyline  11/26/2014  . Erythromycin  11/26/2014  . Lopressor [metoprolol tartrate]  11/26/2014  . Macrolides and ketolides  11/26/2014  . Pamelor [nortriptyline]  11/26/2014  . Penicillins  11/26/2014  . Sulfa antibiotics  11/26/2014  . Ciprocin-fluocin-procin [fluocinolone] Rash 11/27/2014  . Flagyl [metronidazole] Rash 11/26/2014    Family History  Problem Relation Age of Onset  . Breast cancer Paternal Aunt     Social History   Social History  . Marital status: Widowed    Spouse name: N/A  . Number of children: N/A  . Years of education: N/A   Occupational History  . Not on file.   Social History Main Topics  . Smoking status: Former Research scientist (life sciences)  . Smokeless tobacco: Never Used  . Alcohol use No  . Drug use: No  . Sexual activity: Not on file   Other Topics Concern  . Not on file   Social History Narrative  . No narrative on file    Review of Systems: See HPI, otherwise negative ROS  Physical Exam: BP (!) 146/72   Pulse 71   Temp 97.6 F (36.4 C) (Tympanic)   Resp 18   Ht 5' 1.5" (1.562 m)   Wt 81.6 kg (180 lb)   SpO2 98%   BMI 33.46 kg/m  General:   Alert,  pleasant and cooperative in NAD Head:  Normocephalic and atraumatic. Neck:  Supple; no  masses or thyromegaly. Lungs:  Clear throughout to auscultation.    Heart:  Regular rate and rhythm. Abdomen:  Soft, nontender and nondistended. Normal bowel sounds, without guarding, and without rebound.   Neurologic:  Alert and  oriented x4;  grossly normal neurologically.  Impression/Plan: Marilyn [name] is here for an colonoscopy to be performed for follow up collagenous colitis.  Risks, benefits, limitations, and alternatives regarding  colonoscopy have been reviewed with the patient.  Questions have been answered.  All parties agreeable.   Gaylyn Cheers, MD  05/21/2016, 12:58 PM

## 2016-05-21 NOTE — Anesthesia Post-op Follow-up Note (Cosign Needed)
Anesthesia QCDR form completed.        

## 2016-05-21 NOTE — Op Note (Signed)
[facility] Gastroenterology Patient Name: Marilyn [name] Procedure Date: 05/21/2016 1:00 PM MRN: [id number] Account #: [id number] Date of Birth: 07/31/1945 Admit Type: Outpatient Age: 71 Room: [facility] ENDO ROOM 1 Gender: Female Note Status: Finalized Procedure:            Colonoscopy Indications:          collagenous colitis Providers:            Manya Silvas, MD Referring MD:         Hewitt Blade. Sarina Ser, MD (Referring MD) Medicines:            Propofol per Anesthesia Complications:        No immediate complications. Procedure:            Pre-Anesthesia Assessment:                       - After reviewing the risks and benefits, the patient                        was deemed in satisfactory condition to undergo the                        procedure.                       After obtaining informed consent, the colonoscope was                        passed under direct vision. Throughout the procedure,                        the patient's blood pressure, pulse, and oxygen                        saturations were monitored continuously. The                        Colonoscope was introduced through the anus and                        advanced to the the cecum, identified by appendiceal                        orifice and ileocecal valve. The colonoscopy was                        somewhat difficult due to a tortuous colon. Successful                        completion of the procedure was aided by applying                        abdominal pressure. The patient tolerated the procedure                        well. The quality of the bowel preparation was                        excellent. Findings:      A medium polyp was found in the cecum. The polyp was sessile. The polyp  was removed with a hot snare. Resection and retrieval were complete. To       prevent bleeding after the polypectomy, two hemostatic clips were       successfully placed. There was no bleeding  during, or at the end, of the       procedure. The polyp was cut into 3 pieces and suctioned up the scope.      Patchy mild inflammation characterized by granularity was found in the       ascending colon. Biopsies for histology were taken with a cold forceps       from the ascending colon, transverse colon and descending colon for       evaluation of microscopic colitis.      A diminutive polyp was found in the rectum. The polyp was sessile. The       polyp was removed with a hot snare. Resection was complete, and       retrieval was complete.      Internal hemorrhoids were found during endoscopy. The hemorrhoids were       small and Grade I (internal hemorrhoids that do not prolapse).      A few small-mouthed diverticula were found in the sigmoid colon. Impression:           - One medium polyp in the cecum, removed with a hot                        snare. Resected and retrieved. Clips were placed.                       - Patchy mild inflammation was found in the ascending                        colon secondary to colitis. Biopsied.                       - One diminutive polyp in the rectum, removed with a                        hot snare. Resected and retrieved.                       - Internal hemorrhoids. Recommendation:       - Await pathology results. Stay on Entocort. Increase                        to 3 pills a day for a month then see if can take it                        every other day 3 pills day. Manya Silvas, MD 05/21/2016 2:00:35 PM This report has been signed electronically. Number of Addenda: 0 Note Initiated On: 05/21/2016 1:00 PM Scope Withdrawal Time: 0 hours 34 minutes 19 seconds  Total Procedure Duration: 0 hours 45 minutes 21 seconds       [facility]

## 2016-05-24 LAB — SURGICAL PATHOLOGY

## 2016-05-27 ENCOUNTER — Encounter: Payer: Self-pay | Admitting: Unknown Physician Specialty

## 2016-06-02 ENCOUNTER — Ambulatory Visit (INDEPENDENT_AMBULATORY_CARE_PROVIDER_SITE_OTHER): Payer: Medicare Other | Admitting: Podiatry

## 2016-06-02 DIAGNOSIS — M778 Other enthesopathies, not elsewhere classified: Secondary | ICD-10-CM

## 2016-06-02 DIAGNOSIS — L600 Ingrowing nail: Secondary | ICD-10-CM | POA: Diagnosis not present

## 2016-06-02 DIAGNOSIS — M7750 Other enthesopathy of unspecified foot: Secondary | ICD-10-CM | POA: Diagnosis not present

## 2016-06-02 DIAGNOSIS — M779 Enthesopathy, unspecified: Principal | ICD-10-CM

## 2016-06-02 NOTE — Progress Notes (Signed)
Subjective:    Patient ID: Marilyn [name], female   DOB: 71 y.o.   MRN: [id number]   HPI patient presents stating the joint is doing better and I'm also having trouble with my ingrown toenail my right big toe. Patient has 2 different problems today    ROS      Objective:  Physical Exam Neurovascular status intact muscle strength adequate with patient found to have inflammatory changes second MPJ right which is improved with still mild discomfort but quite a bit better with incurvated right hallux medial border that's painful when pressed    Assessment:   Inflammatory capsulitis improving with ingrown toenail right hallux      Plan:    H&P and reviewed both conditions. We discussed possibility for orthotics for the inflamed capsule and we will hold off on that today and I did go ahead today and I've recommended correction of the ingrown toenail to do which she cannot do today but wants to get it done one point in future. Discussed removal of the corner and explained procedure and risk
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
## 2017-02-09 ENCOUNTER — Encounter: Payer: Self-pay | Admitting: Podiatry

## 2017-02-09 ENCOUNTER — Other Ambulatory Visit: Payer: Self-pay | Admitting: Podiatry

## 2017-02-09 ENCOUNTER — Ambulatory Visit (INDEPENDENT_AMBULATORY_CARE_PROVIDER_SITE_OTHER): Payer: Medicare Other | Admitting: Podiatry

## 2017-02-09 ENCOUNTER — Ambulatory Visit (INDEPENDENT_AMBULATORY_CARE_PROVIDER_SITE_OTHER): Payer: Medicare Other

## 2017-02-09 DIAGNOSIS — M779 Enthesopathy, unspecified: Secondary | ICD-10-CM

## 2017-02-09 DIAGNOSIS — M79671 Pain in right foot: Secondary | ICD-10-CM | POA: Diagnosis not present

## 2017-02-09 DIAGNOSIS — M778 Other enthesopathies, not elsewhere classified: Secondary | ICD-10-CM

## 2017-02-09 DIAGNOSIS — M79672 Pain in left foot: Secondary | ICD-10-CM | POA: Diagnosis not present

## 2017-02-09 DIAGNOSIS — M7750 Other enthesopathy of unspecified foot: Secondary | ICD-10-CM | POA: Diagnosis not present

## 2017-02-09 MED ORDER — TRIAMCINOLONE ACETONIDE 10 MG/ML IJ SUSP
10.0000 mg | Freq: Once | INTRAMUSCULAR | Status: AC
  Administered 2017-02-09: 10 mg

## 2017-02-09 NOTE — Progress Notes (Signed)
Subjective:   Patient ID: Marilyn [name], female   DOB: 72 y.o.   MRN: [id number]   HPI Patient presents with discomfort in the plantar aspect right foot around the second MPJ with fluid buildup in the left foot seems to have some irritation with no history of acute injury   ROS      Objective:  Physical Exam  Neurovascular status intact with inflammation discomfort within the plantar second MPJ with discomfort of the left ankle localized in nature     Assessment:  Inflammatory capsulitis right with capsulitis tendinitis left     Plan:  H&P condition reviewed and recommended treatment for the right foot and did proximal nerve block and under sterile conditions did a small aspiration of the joint getting out a small amount of clear fluid and injected quarter cc dexamethasone Kenalog.  Instructed on rigid bottom shoes and reevaluate again in 2 weeks  X-rays indicated the left foot does have sprain but no indications of fracture in the right foot does not show stress fracture or advanced arthritis

## 2017-02-24 ENCOUNTER — Ambulatory Visit (INDEPENDENT_AMBULATORY_CARE_PROVIDER_SITE_OTHER): Payer: Medicare Other | Admitting: Podiatry

## 2017-02-24 ENCOUNTER — Encounter: Payer: Self-pay | Admitting: Podiatry

## 2017-02-24 DIAGNOSIS — M779 Enthesopathy, unspecified: Secondary | ICD-10-CM

## 2017-02-24 DIAGNOSIS — M7750 Other enthesopathy of unspecified foot: Secondary | ICD-10-CM | POA: Diagnosis not present

## 2017-02-24 DIAGNOSIS — M79676 Pain in unspecified toe(s): Secondary | ICD-10-CM

## 2017-02-24 NOTE — Progress Notes (Signed)
Subjective:   Patient ID: Marilyn [name], female   DOB: 72 y.o.   MRN: [id number]   HPI Patient presents stating the pain has receded quite a bit but I know I need some kind of insert to help keep it from coming back   ROS      Objective:  Physical Exam  Neurovascular status intact with inflammation around the second MPJ right that is improved but still present with also mild pain third MPJ     Assessment:  Capsulitis improved but present     Plan:  H&P and condition reviewed with patient.  At this point patient will be seen by ped orthotist for orthotic scans and I gave instructions on offloading the second metatarsal right and metatarsal padding along with a soft type orthotic device.  Patient is scheduled for this to be done

## 2017-03-16 ENCOUNTER — Other Ambulatory Visit: Payer: Self-pay | Admitting: Physical Medicine and Rehabilitation

## 2017-03-16 DIAGNOSIS — M5416 Radiculopathy, lumbar region: Secondary | ICD-10-CM

## 2017-03-24 ENCOUNTER — Ambulatory Visit: Payer: Medicare Other | Admitting: Orthotics

## 2017-03-24 DIAGNOSIS — M778 Other enthesopathies, not elsewhere classified: Secondary | ICD-10-CM

## 2017-03-24 DIAGNOSIS — M779 Enthesopathy, unspecified: Secondary | ICD-10-CM

## 2017-03-24 DIAGNOSIS — M79672 Pain in left foot: Principal | ICD-10-CM

## 2017-03-24 DIAGNOSIS — M79671 Pain in right foot: Secondary | ICD-10-CM

## 2017-03-24 NOTE — Progress Notes (Signed)
Patient came in today to pick up custom made foot orthotics.  The goals were accomplished and the patient reported no dissatisfaction with said orthotics.  Patient was advised of breakin period and how to report any issues. 

## 2017-03-29 ENCOUNTER — Ambulatory Visit
Admission: RE | Admit: 2017-03-29 | Discharge: 2017-03-29 | Disposition: A | Discharge status: Home / Self Care | Payer: Medicare Other | Source: Ambulatory Visit | Attending: Physical Medicine and Rehabilitation | Admitting: Physical Medicine and Rehabilitation

## 2017-03-29 DIAGNOSIS — M5416 Radiculopathy, lumbar region: Secondary | ICD-10-CM

## 2017-03-29 DIAGNOSIS — M48061 Spinal stenosis, lumbar region without neurogenic claudication: Secondary | ICD-10-CM | POA: Insufficient documentation

## 2017-03-29 DIAGNOSIS — M5116 Intervertebral disc disorders with radiculopathy, lumbar region: Secondary | ICD-10-CM | POA: Diagnosis not present

## 2017-04-04 ENCOUNTER — Other Ambulatory Visit: Payer: Medicare Other | Admitting: Orthotics

## 2017-04-07 ENCOUNTER — Ambulatory Visit: Payer: Medicare Other | Admitting: Orthotics

## 2017-04-07 DIAGNOSIS — M79671 Pain in right foot: Secondary | ICD-10-CM

## 2017-04-07 DIAGNOSIS — M779 Enthesopathy, unspecified: Principal | ICD-10-CM

## 2017-04-07 DIAGNOSIS — M79672 Pain in left foot: Secondary | ICD-10-CM

## 2017-04-07 DIAGNOSIS — M778 Other enthesopathies, not elsewhere classified: Secondary | ICD-10-CM

## 2017-04-07 NOTE — Progress Notes (Signed)
Patient bought f/o for adjustments..need to remove met pad.

## 2017-04-11 ENCOUNTER — Other Ambulatory Visit: Payer: Self-pay | Admitting: Internal Medicine

## 2017-04-11 DIAGNOSIS — Z1239 Encounter for other screening for malignant neoplasm of breast: Secondary | ICD-10-CM

## 2017-04-18 ENCOUNTER — Ambulatory Visit: Payer: Medicare Other | Admitting: Orthotics

## 2017-04-18 DIAGNOSIS — M779 Enthesopathy, unspecified: Secondary | ICD-10-CM

## 2017-04-18 DIAGNOSIS — M79672 Pain in left foot: Principal | ICD-10-CM

## 2017-04-18 DIAGNOSIS — M79671 Pain in right foot: Secondary | ICD-10-CM

## 2017-04-18 NOTE — Progress Notes (Signed)
P/u adjusted f/o.  Seemed pleased

## 2017-05-05 ENCOUNTER — Ambulatory Visit: Payer: Medicare Other | Admitting: Podiatry

## 2017-06-23 ENCOUNTER — Ambulatory Visit
Admission: RE | Admit: 2017-06-23 | Discharge: 2017-06-23 | Disposition: A | Discharge status: Home / Self Care | Payer: Medicare Other | Source: Ambulatory Visit | Attending: Internal Medicine | Admitting: Internal Medicine

## 2017-06-23 DIAGNOSIS — Z1231 Encounter for screening mammogram for malignant neoplasm of breast: Secondary | ICD-10-CM | POA: Insufficient documentation

## 2017-06-23 DIAGNOSIS — Z1239 Encounter for other screening for malignant neoplasm of breast: Secondary | ICD-10-CM

## 2017-09-04 ENCOUNTER — Inpatient Hospital Stay
Admit: 2017-09-04 | Discharge: 2017-09-04 | Disposition: A | Discharge status: Home / Self Care | Payer: Medicare Other | Attending: Internal Medicine | Admitting: Internal Medicine

## 2017-09-04 ENCOUNTER — Encounter: Payer: Self-pay | Admitting: Internal Medicine

## 2017-09-04 ENCOUNTER — Inpatient Hospital Stay: Payer: Self-pay

## 2017-09-04 ENCOUNTER — Inpatient Hospital Stay
Admission: EM | Admit: 2017-09-04 | Discharge: 2017-10-04 | DRG: 871 | Disposition: E | Payer: Medicare Other | Attending: Internal Medicine | Admitting: Internal Medicine

## 2017-09-04 ENCOUNTER — Emergency Department: Payer: Medicare Other

## 2017-09-04 DIAGNOSIS — R740 Nonspecific elevation of levels of transaminase and lactic acid dehydrogenase [LDH]: Secondary | ICD-10-CM | POA: Diagnosis present

## 2017-09-04 DIAGNOSIS — Z881 Allergy status to other antibiotic agents status: Secondary | ICD-10-CM

## 2017-09-04 DIAGNOSIS — R0603 Acute respiratory distress: Secondary | ICD-10-CM

## 2017-09-04 DIAGNOSIS — Z803 Family history of malignant neoplasm of breast: Secondary | ICD-10-CM

## 2017-09-04 DIAGNOSIS — E878 Other disorders of electrolyte and fluid balance, not elsewhere classified: Secondary | ICD-10-CM | POA: Diagnosis present

## 2017-09-04 DIAGNOSIS — Z01818 Encounter for other preprocedural examination: Secondary | ICD-10-CM

## 2017-09-04 DIAGNOSIS — A419 Sepsis, unspecified organism: Secondary | ICD-10-CM | POA: Diagnosis present

## 2017-09-04 DIAGNOSIS — J9811 Atelectasis: Secondary | ICD-10-CM | POA: Diagnosis present

## 2017-09-04 DIAGNOSIS — J9601 Acute respiratory failure with hypoxia: Secondary | ICD-10-CM | POA: Diagnosis present

## 2017-09-04 DIAGNOSIS — Z79899 Other long term (current) drug therapy: Secondary | ICD-10-CM

## 2017-09-04 DIAGNOSIS — I214 Non-ST elevation (NSTEMI) myocardial infarction: Secondary | ICD-10-CM | POA: Diagnosis present

## 2017-09-04 DIAGNOSIS — J302 Other seasonal allergic rhinitis: Secondary | ICD-10-CM | POA: Diagnosis present

## 2017-09-04 DIAGNOSIS — Z8673 Personal history of transient ischemic attack (TIA), and cerebral infarction without residual deficits: Secondary | ICD-10-CM

## 2017-09-04 DIAGNOSIS — E871 Hypo-osmolality and hyponatremia: Secondary | ICD-10-CM | POA: Diagnosis present

## 2017-09-04 DIAGNOSIS — R6520 Severe sepsis without septic shock: Secondary | ICD-10-CM | POA: Diagnosis present

## 2017-09-04 DIAGNOSIS — R570 Cardiogenic shock: Secondary | ICD-10-CM | POA: Diagnosis present

## 2017-09-04 DIAGNOSIS — K219 Gastro-esophageal reflux disease without esophagitis: Secondary | ICD-10-CM | POA: Diagnosis present

## 2017-09-04 DIAGNOSIS — N170 Acute kidney failure with tubular necrosis: Secondary | ICD-10-CM | POA: Diagnosis present

## 2017-09-04 DIAGNOSIS — E872 Acidosis: Secondary | ICD-10-CM | POA: Diagnosis present

## 2017-09-04 DIAGNOSIS — E669 Obesity, unspecified: Secondary | ICD-10-CM | POA: Diagnosis present

## 2017-09-04 DIAGNOSIS — J159 Unspecified bacterial pneumonia: Secondary | ICD-10-CM | POA: Diagnosis present

## 2017-09-04 DIAGNOSIS — Z9842 Cataract extraction status, left eye: Secondary | ICD-10-CM

## 2017-09-04 DIAGNOSIS — D649 Anemia, unspecified: Secondary | ICD-10-CM | POA: Diagnosis present

## 2017-09-04 DIAGNOSIS — Z90722 Acquired absence of ovaries, bilateral: Secondary | ICD-10-CM

## 2017-09-04 DIAGNOSIS — I5021 Acute systolic (congestive) heart failure: Secondary | ICD-10-CM | POA: Diagnosis present

## 2017-09-04 DIAGNOSIS — I081 Rheumatic disorders of both mitral and tricuspid valves: Secondary | ICD-10-CM | POA: Diagnosis present

## 2017-09-04 DIAGNOSIS — Z66 Do not resuscitate: Secondary | ICD-10-CM | POA: Diagnosis not present

## 2017-09-04 DIAGNOSIS — Z6834 Body mass index (BMI) 34.0-34.9, adult: Secondary | ICD-10-CM

## 2017-09-04 DIAGNOSIS — I25110 Atherosclerotic heart disease of native coronary artery with unstable angina pectoris: Secondary | ICD-10-CM | POA: Diagnosis present

## 2017-09-04 DIAGNOSIS — M545 Low back pain: Secondary | ICD-10-CM | POA: Diagnosis present

## 2017-09-04 DIAGNOSIS — Z0189 Encounter for other specified special examinations: Secondary | ICD-10-CM

## 2017-09-04 DIAGNOSIS — Z9841 Cataract extraction status, right eye: Secondary | ICD-10-CM

## 2017-09-04 DIAGNOSIS — G8929 Other chronic pain: Secondary | ICD-10-CM | POA: Diagnosis present

## 2017-09-04 DIAGNOSIS — J189 Pneumonia, unspecified organism: Secondary | ICD-10-CM

## 2017-09-04 DIAGNOSIS — Z880 Allergy status to penicillin: Secondary | ICD-10-CM

## 2017-09-04 DIAGNOSIS — Z515 Encounter for palliative care: Secondary | ICD-10-CM | POA: Diagnosis not present

## 2017-09-04 DIAGNOSIS — Z882 Allergy status to sulfonamides status: Secondary | ICD-10-CM

## 2017-09-04 DIAGNOSIS — E782 Mixed hyperlipidemia: Secondary | ICD-10-CM | POA: Diagnosis present

## 2017-09-04 DIAGNOSIS — Z888 Allergy status to other drugs, medicaments and biological substances status: Secondary | ICD-10-CM

## 2017-09-04 DIAGNOSIS — D72829 Elevated white blood cell count, unspecified: Secondary | ICD-10-CM

## 2017-09-04 DIAGNOSIS — M791 Myalgia, unspecified site: Secondary | ICD-10-CM | POA: Diagnosis present

## 2017-09-04 DIAGNOSIS — I110 Hypertensive heart disease with heart failure: Secondary | ICD-10-CM | POA: Diagnosis present

## 2017-09-04 DIAGNOSIS — R0902 Hypoxemia: Secondary | ICD-10-CM

## 2017-09-04 DIAGNOSIS — J810 Acute pulmonary edema: Secondary | ICD-10-CM

## 2017-09-04 DIAGNOSIS — I200 Unstable angina: Secondary | ICD-10-CM

## 2017-09-04 DIAGNOSIS — Z87891 Personal history of nicotine dependence: Secondary | ICD-10-CM

## 2017-09-04 DIAGNOSIS — E119 Type 2 diabetes mellitus without complications: Secondary | ICD-10-CM | POA: Diagnosis present

## 2017-09-04 DIAGNOSIS — Z90710 Acquired absence of both cervix and uterus: Secondary | ICD-10-CM

## 2017-09-04 LAB — ECHOCARDIOGRAM COMPLETE
HEIGHTINCHES: 61 in
Weight: 2931.24 oz

## 2017-09-04 LAB — LACTIC ACID, PLASMA
Lactic Acid, Venous: 1 mmol/L (ref 0.5–1.9)
Lactic Acid, Venous: 0.8 mmol/L (ref 0.5–1.9)

## 2017-09-04 LAB — COMPREHENSIVE METABOLIC PANEL
ALT: 31 U/L (ref 0–44)
AST: 126 U/L — ABNORMAL HIGH (ref 15–41)
Albumin: 4 g/dL (ref 3.5–5.0)
Alkaline Phosphatase: 63 U/L (ref 38–126)
Anion gap: 16 — ABNORMAL HIGH (ref 5–15)
BILIRUBIN TOTAL: 1.5 mg/dL — AB (ref 0.3–1.2)
BUN: 20 mg/dL (ref 8–23)
CHLORIDE: 94 mmol/L — AB (ref 98–111)
CO2: 19 mmol/L — ABNORMAL LOW (ref 22–32)
Calcium: 9.1 mg/dL (ref 8.9–10.3)
Creatinine, Ser: 0.9 mg/dL (ref 0.44–1.00)
Glucose, Bld: 244 mg/dL — ABNORMAL HIGH (ref 70–99)
POTASSIUM: 5 mmol/L (ref 3.5–5.1)
Sodium: 129 mmol/L — ABNORMAL LOW (ref 135–145)
TOTAL PROTEIN: 7.2 g/dL (ref 6.5–8.1)

## 2017-09-04 LAB — URINALYSIS, COMPLETE (UACMP) WITH MICROSCOPIC
BILIRUBIN URINE: NEGATIVE
Bacteria, UA: NONE SEEN
GLUCOSE, UA: NEGATIVE mg/dL
HGB URINE DIPSTICK: NEGATIVE
Ketones, ur: 5 mg/dL — AB
LEUKOCYTES UA: NEGATIVE
Nitrite: NEGATIVE
PH: 5 (ref 5.0–8.0)
PROTEIN: 30 mg/dL — AB
Specific Gravity, Urine: 1.024 (ref 1.005–1.030)
Squamous Epithelial / LPF: NONE SEEN (ref 0–5)

## 2017-09-04 LAB — BLOOD GAS, ARTERIAL
ACID-BASE DEFICIT: 4.7 mmol/L — AB (ref 0.0–2.0)
BICARBONATE: 20.4 mmol/L (ref 20.0–28.0)
Delivery systems: POSITIVE
FIO2: 100
Mechanical Rate: 10
O2 Saturation: 99.8 %
PH ART: 7.35 (ref 7.350–7.450)
PO2 ART: 257 mmHg — AB (ref 83.0–108.0)
Patient temperature: 37
pCO2 arterial: 37 mmHg (ref 32.0–48.0)

## 2017-09-04 LAB — CBC WITH DIFFERENTIAL/PLATELET
BASOS ABS: 0.1 10*3/uL (ref 0–0.1)
Basophils Relative: 1 %
EOS PCT: 0 %
Eosinophils Absolute: 0 10*3/uL (ref 0–0.7)
HEMATOCRIT: 40.4 % (ref 35.0–47.0)
Hemoglobin: 13.6 g/dL (ref 12.0–16.0)
LYMPHS PCT: 22 %
Lymphs Abs: 5.5 10*3/uL — ABNORMAL HIGH (ref 1.0–3.6)
MCH: 29.9 pg (ref 26.0–34.0)
MCHC: 33.5 g/dL (ref 32.0–36.0)
MCV: 89.2 fL (ref 80.0–100.0)
Monocytes Absolute: 1.6 10*3/uL — ABNORMAL HIGH (ref 0.2–0.9)
Monocytes Relative: 7 %
NEUTROS ABS: 18.2 10*3/uL — AB (ref 1.4–6.5)
Neutrophils Relative %: 70 %
PLATELETS: 427 10*3/uL (ref 150–440)
RBC: 4.53 MIL/uL (ref 3.80–5.20)
RDW: 13 % (ref 11.5–14.5)
WBC: 25.5 10*3/uL — AB (ref 3.6–11.0)

## 2017-09-04 LAB — CBC
HCT: 34.4 % — ABNORMAL LOW (ref 35.0–47.0)
HEMOGLOBIN: 12 g/dL (ref 12.0–16.0)
MCH: 30.6 pg (ref 26.0–34.0)
MCHC: 34.8 g/dL (ref 32.0–36.0)
MCV: 88 fL (ref 80.0–100.0)
PLATELETS: 329 10*3/uL (ref 150–440)
RBC: 3.9 MIL/uL (ref 3.80–5.20)
RDW: 13 % (ref 11.5–14.5)
WBC: 14.8 10*3/uL — AB (ref 3.6–11.0)

## 2017-09-04 LAB — BASIC METABOLIC PANEL
Anion gap: 13 (ref 5–15)
Anion gap: 10 (ref 5–15)
BUN: 26 mg/dL — AB (ref 8–23)
BUN: 28 mg/dL — AB (ref 8–23)
CHLORIDE: 96 mmol/L — AB (ref 98–111)
CHLORIDE: 101 mmol/L (ref 98–111)
CO2: 24 mmol/L (ref 22–32)
CO2: 24 mmol/L (ref 22–32)
CREATININE: 1.91 mg/dL — AB (ref 0.44–1.00)
CREATININE: 1.67 mg/dL — AB (ref 0.44–1.00)
Calcium: 8.5 mg/dL — ABNORMAL LOW (ref 8.9–10.3)
Calcium: 7.9 mg/dL — ABNORMAL LOW (ref 8.9–10.3)
GFR calc non Af Amer: 25 mL/min — ABNORMAL LOW (ref >60)
GFR, EST AFRICAN AMERICAN: 29 mL/min — AB (ref >60)
GFR, EST AFRICAN AMERICAN: 34 mL/min — AB (ref >60)
GFR, EST NON AFRICAN AMERICAN: 29 mL/min — AB (ref >60)
GLUCOSE: 152 mg/dL — AB (ref 70–99)
GLUCOSE: 148 mg/dL — AB (ref 70–99)
Potassium: 4.2 mmol/L (ref 3.5–5.1)
Potassium: 3.9 mmol/L (ref 3.5–5.1)
Sodium: 133 mmol/L — ABNORMAL LOW (ref 135–145)
Sodium: 135 mmol/L (ref 135–145)

## 2017-09-04 LAB — TROPONIN I
TROPONIN I: 14.85 ng/mL — AB (ref <0.03)
TROPONIN I: 6.66 ng/mL — AB (ref <0.03)
TROPONIN I: 9.17 ng/mL — AB (ref <0.03)

## 2017-09-04 LAB — HEMOGLOBIN A1C
HEMOGLOBIN A1C: 6.3 % — AB (ref 4.8–5.6)
Mean Plasma Glucose: 134.11 mg/dL

## 2017-09-04 LAB — PROCALCITONIN: Procalcitonin: 0.44 ng/mL

## 2017-09-04 LAB — PROTIME-INR
INR: 1.09
Prothrombin Time: 14 seconds (ref 11.4–15.2)

## 2017-09-04 LAB — MRSA PCR SCREENING: MRSA BY PCR: NEGATIVE

## 2017-09-04 LAB — GLUCOSE, CAPILLARY
GLUCOSE-CAPILLARY: 111 mg/dL — AB (ref 70–99)
Glucose-Capillary: 106 mg/dL — ABNORMAL HIGH (ref 70–99)
Glucose-Capillary: 93 mg/dL (ref 70–99)

## 2017-09-04 LAB — STREP PNEUMONIAE URINARY ANTIGEN: STREP PNEUMO URINARY ANTIGEN: NEGATIVE

## 2017-09-04 LAB — HEPARIN LEVEL (UNFRACTIONATED): HEPARIN UNFRACTIONATED: 0.71 [IU]/mL — AB (ref 0.30–0.70)

## 2017-09-04 LAB — APTT: APTT: 88 s — AB (ref 24–36)

## 2017-09-04 LAB — BRAIN NATRIURETIC PEPTIDE: B NATRIURETIC PEPTIDE 5: 455 pg/mL — AB (ref 0.0–100.0)

## 2017-09-04 MED ORDER — ASPIRIN 300 MG RE SUPP
RECTAL | Status: AC
  Filled 2017-09-04: qty 1

## 2017-09-04 MED ORDER — ROSUVASTATIN CALCIUM 10 MG PO TABS: 40.0000 mg | ORAL_TABLET | Freq: Every day | ORAL | Status: DC

## 2017-09-04 MED ORDER — LOSARTAN POTASSIUM 50 MG PO TABS: 50.0000 mg | ORAL_TABLET | Freq: Every day | ORAL | Status: DC

## 2017-09-04 MED ORDER — CHLORHEXIDINE GLUCONATE 0.12 % MT SOLN
15.0000 mL | Freq: Two times a day (BID) | OROMUCOSAL | Status: DC
  Administered 2017-09-04 – 2017-09-05 (×2): 15 mL via OROMUCOSAL

## 2017-09-04 MED ORDER — SODIUM CHLORIDE 0.9 % IV SOLN
INTRAVENOUS | Status: DC
  Administered 2017-09-04 – 2017-09-05 (×2): via INTRAVENOUS

## 2017-09-04 MED ORDER — NITROGLYCERIN 0.4 MG SL SUBL: 0.4000 mg | SUBLINGUAL_TABLET | SUBLINGUAL | Status: DC | PRN

## 2017-09-04 MED ORDER — ASPIRIN EC 81 MG PO TBEC
81.0000 mg | DELAYED_RELEASE_TABLET | Freq: Every day | ORAL | Status: DC
  Administered 2017-09-05: 81 mg via ORAL
  Filled 2017-09-04: qty 1

## 2017-09-04 MED ORDER — SENNOSIDES-DOCUSATE SODIUM 8.6-50 MG PO TABS: 1.0000 | ORAL_TABLET | Freq: Every evening | ORAL | Status: DC | PRN

## 2017-09-04 MED ORDER — FUROSEMIDE 10 MG/ML IJ SOLN
20.0000 mg | Freq: Once | INTRAMUSCULAR | Status: AC
  Administered 2017-09-04: 20 mg via INTRAVENOUS

## 2017-09-04 MED ORDER — FUROSEMIDE 10 MG/ML IJ SOLN: 40.0000 mg | Freq: Once | INTRAMUSCULAR | Status: DC

## 2017-09-04 MED ORDER — ORAL CARE MOUTH RINSE
15.0000 mL | Freq: Two times a day (BID) | OROMUCOSAL | Status: DC
  Administered 2017-09-04: 15 mL via OROMUCOSAL

## 2017-09-04 MED ORDER — INSULIN ASPART 100 UNIT/ML ~~LOC~~ SOLN
0.0000 [IU] | Freq: Three times a day (TID) | SUBCUTANEOUS | Status: DC
  Administered 2017-09-05: 3 [IU] via SUBCUTANEOUS
  Filled 2017-09-04: qty 1

## 2017-09-04 MED ORDER — SODIUM CHLORIDE 0.9 % IV SOLN
100.0000 mg | Freq: Two times a day (BID) | INTRAVENOUS | Status: DC
  Administered 2017-09-04 – 2017-09-05 (×3): 100 mg via INTRAVENOUS
  Filled 2017-09-04 (×5): qty 100

## 2017-09-04 MED ORDER — SODIUM CHLORIDE 0.9 % IV SOLN
0.0000 ug/min | INTRAVENOUS | Status: DC
  Administered 2017-09-05: 10 ug/min via INTRAVENOUS
  Administered 2017-09-05: 200 ug/min via INTRAVENOUS
  Administered 2017-09-05: 120 ug/min via INTRAVENOUS
  Filled 2017-09-04 (×2): qty 40
  Filled 2017-09-04: qty 4
  Filled 2017-09-04: qty 40

## 2017-09-04 MED ORDER — NITROGLYCERIN 2 % TD OINT
0.5000 [in_us] | TOPICAL_OINTMENT | Freq: Once | TRANSDERMAL | Status: AC
  Administered 2017-09-04: 0.5 [in_us] via TOPICAL
  Filled 2017-09-04: qty 1

## 2017-09-04 MED ORDER — MORPHINE SULFATE (PF) 2 MG/ML IV SOLN
2.0000 mg | INTRAVENOUS | Status: DC | PRN
  Administered 2017-09-04: 2 mg via INTRAVENOUS
  Administered 2017-09-04: 4 mg via INTRAVENOUS
  Administered 2017-09-04 – 2017-09-05 (×5): 2 mg via INTRAVENOUS
  Administered 2017-09-05: 4 mg via INTRAVENOUS
  Filled 2017-09-04: qty 2
  Filled 2017-09-04 (×2): qty 1
  Filled 2017-09-04: qty 2
  Filled 2017-09-04: qty 1
  Filled 2017-09-04: qty 2
  Filled 2017-09-04: qty 1

## 2017-09-04 MED ORDER — ASPIRIN 81 MG PO CHEW
324.0000 mg | CHEWABLE_TABLET | Freq: Once | ORAL | Status: DC
  Filled 2017-09-04: qty 4

## 2017-09-04 MED ORDER — SODIUM CHLORIDE 0.9 % IV SOLN
1.0000 g | INTRAVENOUS | Status: DC
  Administered 2017-09-04 – 2017-09-05 (×2): 1 g via INTRAVENOUS
  Filled 2017-09-04: qty 10
  Filled 2017-09-04: qty 1

## 2017-09-04 MED ORDER — LORAZEPAM 2 MG/ML IJ SOLN: 0.5000 mg | Freq: Once | INTRAMUSCULAR | Status: DC

## 2017-09-04 MED ORDER — SODIUM BICARBONATE 8.4 % IV SOLN
50.0000 meq | Freq: Once | INTRAVENOUS | Status: AC
  Administered 2017-09-04: 50 meq via INTRAVENOUS
  Filled 2017-09-04: qty 50

## 2017-09-04 MED ORDER — ONDANSETRON HCL 4 MG/2ML IJ SOLN
4.0000 mg | Freq: Four times a day (QID) | INTRAMUSCULAR | Status: DC | PRN
  Administered 2017-09-04 – 2017-09-05 (×2): 4 mg via INTRAVENOUS
  Filled 2017-09-04 (×2): qty 2

## 2017-09-04 MED ORDER — BISACODYL 5 MG PO TBEC: 5.0000 mg | DELAYED_RELEASE_TABLET | Freq: Every day | ORAL | Status: DC | PRN

## 2017-09-04 MED ORDER — ASPIRIN 300 MG RE SUPP
300.0000 mg | Freq: Once | RECTAL | Status: AC
  Administered 2017-09-04: 300 mg via RECTAL

## 2017-09-04 MED ORDER — SODIUM CHLORIDE 0.9 % IV SOLN
0.0000 ug/min | INTRAVENOUS | Status: DC
  Administered 2017-09-04: 20 ug/min via INTRAVENOUS
  Filled 2017-09-04: qty 10

## 2017-09-04 MED ORDER — ACETAMINOPHEN 325 MG PO TABS: 650.0000 mg | ORAL_TABLET | ORAL | Status: DC | PRN

## 2017-09-04 MED ORDER — HEPARIN BOLUS VIA INFUSION
4000.0000 [IU] | Freq: Once | INTRAVENOUS | Status: AC
  Administered 2017-09-04: 4000 [IU] via INTRAVENOUS
  Filled 2017-09-04: qty 4000

## 2017-09-04 MED ORDER — CARVEDILOL 6.25 MG PO TABS: 3.1250 mg | ORAL_TABLET | Freq: Two times a day (BID) | ORAL | Status: DC

## 2017-09-04 MED ORDER — ATORVASTATIN CALCIUM 20 MG PO TABS: 40.0000 mg | ORAL_TABLET | Freq: Every day | ORAL | Status: DC

## 2017-09-04 MED ORDER — LOSARTAN POTASSIUM 50 MG PO TABS: 50.0000 mg | ORAL_TABLET | Freq: Two times a day (BID) | ORAL | Status: DC

## 2017-09-04 MED ORDER — FUROSEMIDE 10 MG/ML IJ SOLN
INTRAMUSCULAR | Status: AC
  Filled 2017-09-04: qty 10

## 2017-09-04 MED ORDER — MONTELUKAST SODIUM 10 MG PO TABS: 10.0000 mg | ORAL_TABLET | Freq: Every day | ORAL | Status: DC

## 2017-09-04 MED ORDER — HEPARIN SODIUM (PORCINE) 5000 UNIT/ML IJ SOLN: 4000.0000 [IU] | Freq: Once | INTRAMUSCULAR | Status: DC

## 2017-09-04 MED ORDER — HEPARIN (PORCINE) IN NACL 100-0.45 UNIT/ML-% IJ SOLN
850.0000 [IU]/h | INTRAMUSCULAR | Status: DC
  Administered 2017-09-04 (×2): 850 [IU]/h via INTRAVENOUS
  Filled 2017-09-04 (×2): qty 250

## 2017-09-04 NOTE — Progress Notes (Signed)
Post abg fio2 to 50 % tolerating well sat 97 %

## 2017-09-04 NOTE — ED Notes (Signed)
hospitalist notified not able to obtain blood cultures prior to antibiotics. md states to not delay antibiotic administration.

## 2017-09-04 NOTE — ED Provider Notes (Signed)
[facility] Emergency Department Provider Note   ____________________________________________   First MD Initiated Contact with Patient 09/17/2017 0009     (approximate)  I have reviewed the triage vital signs and the nursing notes.   HISTORY  Chief Complaint SOB   HPI Marilyn [name] is a 72 y.o. female brought to the ED from home via EMS with a chief complaint of respiratory distress.  Patient has a history of hypertension, hyperlipidemia who has been feeling short of breath for the past 2 weeks.  Saw her PCP 2 days ago who switched her from omeprazole to Nexium for GERD which was thought to cause her breathing difficulty.  EMS reports room air saturations in the 60s.  Patient denies recent fever, chills, cough, abdominal pain, nausea or vomiting.  Does complain of chest tightness with breathing difficulty.  Denies recent travel or trauma.    Past Medical History:  Diagnosis Date  . Anemia   . Carpal tunnel syndrome   . Cataract cortical, senile   . Esophageal reflux   . Essential hypertension   . History of chicken pox   . Hyperlipidemia   . Mini stroke ([place])   . Mixed hyperlipidemia   . Osteoarthrosis, not specified whether generalized/localized, lower leg   . Seasonal allergies     Patient Active Problem List   Diagnosis Date Noted  . Acute hypoxemic respiratory failure ([place]) 09/23/2017    Past Surgical History:  Procedure Laterality Date  . CATARACT EXTRACTION Bilateral    left 1989; right 1991  . COLONOSCOPY WITH PROPOFOL N/A 11/27/2014   Procedure: COLONOSCOPY WITH PROPOFOL;  Surgeon: Manya Silvas, MD;  Location: [facility] ENDOSCOPY;  Service: Endoscopy;  Laterality: N/A;  . COLONOSCOPY WITH PROPOFOL N/A 05/21/2016   Procedure: COLONOSCOPY WITH PROPOFOL;  Surgeon: Manya Silvas, MD;  Location: [facility] ENDOSCOPY;  Service: Endoscopy;  Laterality: N/A;  . EYE SURGERY    . KNEE ARTHROSCOPY W/ PARTIAL MEDIAL MENISCECTOMY Left 06/23/2004  .  Makoplasty Left 07/15/2011  . SALPINGOOPHORECTOMY    . TOTAL ABDOMINAL HYSTERECTOMY      Prior to Admission medications   Medication Sig Start Date End Date Taking? Authorizing Provider  amLODipine (NORVASC) 2.5 MG tablet Take 2.5 mg by mouth daily.   Yes [provider]  diphenhydramine-acetaminophen (TYLENOL PM) 25-500 MG TABS tablet Take 1 tablet by mouth at bedtime as needed.   Yes [provider]  esomeprazole (NEXIUM) 40 MG capsule Take 40 mg by mouth daily. 08/29/17  Yes [provider]  fluconazole (DIFLUCAN) 150 MG tablet Take 150 mg by mouth daily. 08/28/17  Yes [provider]  fluticasone (FLONASE) 50 MCG/ACT nasal spray Place into both nostrils daily.   Yes [provider]  LORazepam (ATIVAN) 0.5 MG tablet Take 1 tablet by mouth at bedtime as needed. 08/11/17  Yes [provider]  losartan (COZAAR) 50 MG tablet Take 50 mg by mouth 2 (two) times daily.   Yes [provider]  montelukast (SINGULAIR) 10 MG tablet Take 10 mg by mouth at bedtime.   Yes [provider]  Multiple Vitamins-Minerals (MULTIVITAMIN ADULT PO) Take 1 tablet by mouth daily.    Yes [provider]  omeprazole (PRILOSEC) 40 MG capsule Take 40 mg by mouth daily. 08/07/17  Yes [provider]  traZODone (DESYREL) 100 MG tablet Take 100 mg by mouth at bedtime. 07/06/17  Yes [provider]    Allergies Amitriptyline; Erythromycin; Lopressor [metoprolol tartrate]; Macrolides and ketolides;  Pamelor [nortriptyline]; Penicillins; Sulfa antibiotics; Ciprocin-fluocin-procin [fluocinolone]; and Flagyl [metronidazole]  Family History  Problem Relation Age of Onset  . Breast cancer Paternal Aunt     Social History Social History   Tobacco Use  . Smoking status: Former Research scientist (life sciences)  . Smokeless tobacco: Never Used  Substance Use Topics  . Alcohol use: No  . Drug use: No    Review of Systems  Constitutional: No  fever/chills Eyes: No visual changes. ENT: No sore throat. Cardiovascular: Positive for chest pain. Respiratory: Positive for shortness of breath. Gastrointestinal: No abdominal pain.  No nausea, no vomiting.  No diarrhea.  No constipation. Genitourinary: Negative for dysuria. Musculoskeletal: Negative for back pain. Skin: Negative for rash. Neurological: Negative for headaches, focal weakness or numbness.   ____________________________________________   PHYSICAL EXAM:  VITAL SIGNS: ED Triage Vitals  Enc Vitals Group     BP      Pulse      Resp      Temp      Temp src      SpO2      Weight      Height      Head Circumference      Peak Flow      Pain Score      Pain Loc      Pain Edu?      Excl. in [place]?     Constitutional: Alert and oriented.  Ill appearing and in moderate acute distress. Eyes: Conjunctivae are normal. PERRL. EOMI. Head: Atraumatic. Nose: No congestion/rhinnorhea. Mouth/Throat: Mucous membranes are moist.  Oropharynx non-erythematous. Neck: No stridor.   Cardiovascular: Tachycardic rate, regular rhythm. Grossly normal heart sounds.  Good peripheral circulation. Respiratory: Increased respiratory effort.  No retractions. Lungs with audible rales. Gastrointestinal: Soft and nontender. No distention. No abdominal bruits. No CVA tenderness. Musculoskeletal: No lower extremity tenderness nor edema.  No joint effusions. Neurologic:  Normal speech and language. No gross focal neurologic deficits are appreciated.  Skin:  Skin is warm, dry and intact. No rash noted. Psychiatric: Mood and affect are normal. Speech and behavior are normal.  ____________________________________________   LABS (all labs ordered are listed, but only abnormal results are displayed)  Labs Reviewed  CBC WITH DIFFERENTIAL/PLATELET - Abnormal; Notable for the following components:      Result Value   WBC 25.5 (*)    Neutro Abs 18.2 (*)    Lymphs Abs 5.5 (*)    Monocytes  Absolute 1.6 (*)    All other components within normal limits  COMPREHENSIVE METABOLIC PANEL - Abnormal; Notable for the following components:   Sodium 129 (*)    Chloride 94 (*)    CO2 19 (*)    Glucose, Bld 244 (*)    AST 126 (*)    Total Bilirubin 1.5 (*)    Anion gap 16 (*)    All other components within normal limits  BRAIN NATRIURETIC PEPTIDE - Abnormal; Notable for the following components:   B Natriuretic Peptide 455.0 (*)    All other components within normal limits  TROPONIN I - Abnormal; Notable for the following components:   Troponin I 6.66 (*)    All other components within normal limits  BLOOD GAS, ARTERIAL - Abnormal; Notable for the following components:   pO2, Arterial 257 (*)    Acid-base deficit 4.7 (*)    All other components within normal limits  URINALYSIS, COMPLETE (UACMP) WITH MICROSCOPIC - Abnormal; Notable for the following components:   Color, Urine AMBER (*)  APPearance HAZY (*)    Ketones, ur 5 (*)    Protein, ur 30 (*)    All other components within normal limits  TROPONIN I - Abnormal; Notable for the following components:   Troponin I 9.17 (*)    All other components within normal limits  CULTURE, BLOOD (ROUTINE X 2)  CULTURE, BLOOD (ROUTINE X 2)  EXPECTORATED SPUTUM ASSESSMENT W REFEX TO RESP CULTURE  GRAM STAIN  MRSA PCR SCREENING  APTT  PROTIME-INR  HIV ANTIBODY (ROUTINE TESTING)  STREP PNEUMONIAE URINARY ANTIGEN  PROCALCITONIN  LEGIONELLA PNEUMOPHILA SEROGP 1 UR AG  HEPARIN LEVEL (UNFRACTIONATED)  TROPONIN I  CBC  HEMOGLOBIN A1C   ____________________________________________  EKG  ED ECG REPORT I, Samika Vetsch J, the attending physician, personally viewed and interpreted this ECG.   Date: 09/15/2017  EKG Time: 0013  Rate: 103  Rhythm: sinus tachycardia  Axis: Normal  Intervals:none  ST&T Change: ST depression inferior lateral leads  ED ECG REPORT I, Viana Sleep J, the attending physician, personally viewed and interpreted  this ECG.   Date: 09/29/2017  EKG Time: 0028  Rate: 94  Rhythm: normal EKG, normal sinus rhythm  Axis: Normal  Intervals:none  ST&T Change: 2 mm ST elevation in lead aVR with reciprocal ST depression in lateral leads  ____________________________________________  RADIOLOGY  ED MD interpretation: Pulmonary edema  Official radiology report(s): Dg Chest Port 1 View  Result Date: 09/07/2017 CLINICAL DATA:  72 year old female with respiratory distress and shortness of breath. EXAM: PORTABLE CHEST 1 VIEW COMPARISON:  None. FINDINGS: There is diffuse interstitial and central vascular prominence consistent with vascular congestion. Trace bilateral pleural effusions noted. Superimposed pneumonia is not excluded. No pneumothorax. The cardiac silhouette is within normal limits. No acute osseous pathology. IMPRESSION: Interstitial edema with trace bilateral pleural effusions. Findings may represent noncardiogenic pulmonary edema. Clinical correlation is recommended. Electronically Signed   By: Anner Crete M.D.   On: 09/13/2017 00:51    ____________________________________________   PROCEDURES  Procedure(s) performed: None  Procedures  Critical Care performed: Yes, see critical care note(s)   CRITICAL CARE Performed by: Paulette Blanch   Total critical care time: 60 minutes  Critical care time was exclusive of separately billable procedures and treating other patients.  Critical care was necessary to treat or prevent imminent or life-threatening deterioration.  Critical care was time spent personally by me on the following activities: development of treatment plan with patient and/or surrogate as well as nursing, discussions with consultants, evaluation of patient's response to treatment, examination of patient, obtaining history from patient or surrogate, ordering and performing treatments and interventions, ordering and review of laboratory studies, ordering and review of radiographic  studies, pulse oximetry and re-evaluation of patient's condition.  ____________________________________________   INITIAL IMPRESSION / ASSESSMENT AND PLAN / ED COURSE  As part of my medical decision making, I reviewed the following data within the [place] notes reviewed and incorporated, Labs reviewed, EKG interpreted, Old chart reviewed, Radiograph reviewed, Discussed with admitting physician and Notes from prior ED visits   72 year old female with hypertension, hyperlipidemia who presents with acute respiratory distress. Differential includes, but is not limited to, viral syndrome, bronchitis including COPD exacerbation, pneumonia, reactive airway disease including asthma, CHF including exacerbation with or without pulmonary/interstitial edema, pneumothorax, ACS, thoracic trauma, and pulmonary embolism.  Patient was immediately placed on BiPAP upon her arrival to the emergency department.  Audible rales heard.  Will obtain stat portable chest.  Anticipate need for IV diuresis.  Clinical  Course as of Sep 04 424  Sun Sep 04, 2017  0045 Initial EKG with large degree of motion artifact.  Repeat EKG concerning for acute ischemia with ST elevation in lead aVR with reciprocal ST depression in the lateral leads.  I spoke with  STEMI cardiologist on-call Dr. Fletcher Anon who reviewed patient's EKG.  Does not meet NSTEMI criteria.  He recommends heparin drip and IV diuresis.  I have spoken with hospitalist services who will evaluate patient in the emergency department for admission to the CCU.   [JS]  [id number] Patient appears more comfortable on BiPAP.  States she does not need IV Ativan as she is currently more comfortable.  Updated patient who agrees with plan of care.   [JS]  7092 I have personally visualized patient's chest x-ray which is consistent with overt pulmonary edema.  20 mg IV Lasix ordered for diuresis.  May give additional Lasix for further diuresis but patient has  never taken it before so we will start with a low dose as to not drop her blood pressure precipitously.   [JS]  0050 Leukocytosis noted.  Patient denies recent fever or cough.  Will check urinalysis.   [JS]    Clinical Course User Index [JS] Paulette Blanch, MD     ____________________________________________   FINAL CLINICAL IMPRESSION(S) / ED DIAGNOSES  Final diagnoses:  Respiratory distress  Hypoxia  Acute pulmonary edema (HCC)  Unstable angina pectoris (HCC)  Leukocytosis, unspecified type  Non-STEMI (non-ST elevated myocardial infarction) [facility])     ED Discharge Orders    None       Note:  This document was prepared using Dragon voice recognition software and may include unintentional dictation errors.    Paulette Blanch, MD 09/24/[id number]

## 2017-09-04 NOTE — ED Notes (Signed)
Critical troponin of 6.66 called from lab. Dr. Beather Arbour notified.

## 2017-09-04 NOTE — Progress Notes (Signed)
[place] Progress Note Patient Name: Marilyn [name] DOB: Mar 31, 1945 MRN: [id number]   Date of Service  09/08/2017  HPI/Events of Note  Fever to 102.3 F - Patient has already been cultured and placed on Ceftriaxone and Doxycycline. AST elevated at 126, therefore, can't have Tylenol. Creatinine = 1.67, therefore, reluctant to use Motrin.   eICU Interventions  Will order: 1. Ice Packs PRN. 2. Cooling blanket PRN.      Intervention Category Major Interventions: Infection - evaluation and management  Sommer,Steven Eugene 09/25/2017, 11:48 PM

## 2017-09-04 NOTE — ED Notes (Signed)
Ray with iv team here for iv insertion. Verified heparin dose with ray ulcom, rn.

## 2017-09-04 NOTE — ED Notes (Signed)
Lab called for venipuncture assist.  

## 2017-09-04 NOTE — ED Notes (Signed)
Waiting on RT for transport to floor.

## 2017-09-04 NOTE — ED Notes (Addendum)
Lab here for venipuncture assist with blood cultures.

## 2017-09-04 NOTE — Consult Note (Signed)
ANTICOAGULATION CONSULT NOTE - Initial Consult  Pharmacy Consult for Heparin Drip  Indication: chest pain/ACS  Allergies  Allergen Reactions  . Amitriptyline     sedating  . Erythromycin     unknown  . Lopressor [Metoprolol Tartrate]     Fatigue/malaise  . Macrolides And Ketolides     Macrolide antibiotics  . Pamelor [Nortriptyline]     Unknown   . Penicillins   . Sulfa Antibiotics   . Ciprocin-Fluocin-Procin [Fluocinolone] Rash  . Flagyl [Metronidazole] Rash    Patient Measurements: Height: 5\' 1"  (154.9 cm) Weight: 183 lb 3.2 oz (83.1 kg) IBW/kg (Calculated) : 47.8 Heparin Dosing Weight: 70 kg  Vital Signs: Temp: 98.1 F (36.7 C) (09/01 0800) Temp Source: Axillary (09/01 0800) BP: 71/38 (09/01 1000) Pulse Rate: 80 (09/01 1000)  Labs: Recent Labs    09/30/2017 0018 09/23/2017 0134 09/15/2017 1129  HGB 13.6  --  12.0  HCT 40.4  --  34.4*  PLT 427  --  329  APTT  --   --  88*  LABPROT  --   --  14.0  INR  --   --  1.09  HEPARINUNFRC  --   --  0.71*  CREATININE 0.90  --   --   TROPONINI 6.66* 9.17*  --     Estimated Creatinine Clearance: 54.4 mL/min (by C-G formula based on SCr of 0.9 mg/dL).  Assessment: Pharmacy consulted for heparin drip dosing and monitoring in 72 yo female admitted with ACS/STEMI.  HEPARIN COURSE DATE TIME HL CHANGE  9/1 0107 -- Initiate heparin infusion 4000 units IV x 1 bolus and 850 unit/hr rate 9/1  1129 0.71 No change due to line draw/difficult stick - recheck in 6 hours instead of 8   Goal of Therapy:  Heparin level 0.3-0.7 units/ml Monitor platelets by anticoagulation protocol: Yes   Plan:  HL slightly supratherapeutic at 0.71 but difficult stick and lab has been drawn from line. APTT is WNL. We will not change the rate at this time but will check a HL in 6 hours instead of 8 to make sure level is not supratherapeutic.  Laural Benes, PharmD, BCPS Clinical Pharmacist 09/24/2017 12:38 PM

## 2017-09-04 NOTE — Progress Notes (Signed)
[place] Progress Note Patient Name: Marilyn [name] DOB: 09/22/1945 MRN: [id number]   Date of Service  09/12/2017  HPI/Events of Note  Hypotension - BP = 85/49. CXR in AM c/w pulmonary edema and patient is BiPAP dependent. Therefore, will avoid giving more fluid.   eICU Interventions  Will order:  1. Phenylephrine IV infusion. Titrate to MAP >= 65.      Intervention Category Major Interventions: Hypotension - evaluation and management  Sommer,Steven Eugene 09/28/2017, 10:01 PM

## 2017-09-04 NOTE — Progress Notes (Signed)
Seeing the patient in ICU.  The patient complains of substernal chest pain.  On BiPAP. Vital signs the labs reviewed.  Physical examinations done. Continue current treatment.  Follow intensivist and cardiologist recommendation.  Discussed with the patient.  Time spent about 27 minutes.

## 2017-09-04 NOTE — Consult Note (Signed)
Name: MALEKA CONTINO MRN: [id number] DOB: 12-Mar-1945     CONSULTATION DATE: 09/06/2017  HISTORY OF PRESENT ILLNESS:   72 years old lady with a history of dyslipidemia, TIA, hypertension.  She presented to the ED with worsening shortness of breath, initial sats of 78% on room air and she was placed on a BiPAP by the ED provider.  Troponin VI 0.66 and she is admitted with acute coronary syndrome/NSTEMI with decompensated congestive heart failure. Patient is currently in ice unit on BiPAP awake in no distress. All history was obtained from nursing staff, the patient and EMR  PAST MEDICAL HISTORY :   has a past medical history of Anemia, Carpal tunnel syndrome, Cataract cortical, senile, Esophageal reflux, Essential hypertension, History of chicken pox, Hyperlipidemia, Mini stroke ([place]), Mixed hyperlipidemia, Osteoarthrosis, not specified whether generalized/localized, lower leg, and Seasonal allergies.  has a past surgical history that includes Salpingoophorectomy; Total abdominal hysterectomy; Cataract extraction (Bilateral); Knee arthroscopy w/ partial medial meniscectomy (Left, 06/23/2004); Makoplasty (Left, 07/15/2011); Eye surgery; Colonoscopy with propofol (N/A, 11/27/2014); and Colonoscopy with propofol (N/A, 05/21/2016). Prior to Admission medications   Medication Sig Start Date End Date Taking? Authorizing Provider  amLODipine (NORVASC) 2.5 MG tablet Take 2.5 mg by mouth daily.   Yes [provider]  diphenhydramine-acetaminophen (TYLENOL PM) 25-500 MG TABS tablet Take 1 tablet by mouth at bedtime as needed.   Yes [provider]  esomeprazole (NEXIUM) 40 MG capsule Take 40 mg by mouth daily. 08/29/17  Yes [provider]  fluconazole (DIFLUCAN) 150 MG tablet Take 150 mg by mouth daily. 08/28/17  Yes [provider]  fluticasone (FLONASE) 50 MCG/ACT nasal spray Place into both nostrils daily.   Yes [provider]  LORazepam (ATIVAN) 0.5 MG tablet  Take 1 tablet by mouth at bedtime as needed. 08/11/17  Yes [provider]  losartan (COZAAR) 50 MG tablet Take 50 mg by mouth 2 (two) times daily.   Yes [provider]  montelukast (SINGULAIR) 10 MG tablet Take 10 mg by mouth at bedtime.   Yes [provider]  Multiple Vitamins-Minerals (MULTIVITAMIN ADULT PO) Take 1 tablet by mouth daily.    Yes [provider]  omeprazole (PRILOSEC) 40 MG capsule Take 40 mg by mouth daily. 08/07/17  Yes [provider]  traZODone (DESYREL) 100 MG tablet Take 100 mg by mouth at bedtime. 07/06/17  Yes [provider]   Allergies  Allergen Reactions  . Amitriptyline     sedating  . Erythromycin     unknown  . Lopressor [Metoprolol Tartrate]     Fatigue/malaise  . Macrolides And Ketolides     Macrolide antibiotics  . Pamelor [Nortriptyline]     Unknown   . Penicillins   . Sulfa Antibiotics   . Ciprocin-Fluocin-Procin [Fluocinolone] Rash  . Flagyl [Metronidazole] Rash    FAMILY HISTORY:  family history includes Breast cancer in her paternal aunt. SOCIAL HISTORY:  reports that she has quit smoking. She has never used smokeless tobacco. She reports that she does not drink alcohol or use drugs.  REVIEW OF SYSTEMS:   Unable to obtain due to critical illness   VITAL SIGNS: Temp:  [98.3 F (36.8 C)-99.5 F (37.5 C)] 99.5 F (37.5 C) (09/01 0200) Pulse Rate:  [86-98] 86 (09/01 0740) Resp:  [20-50] 28 (09/01 0500) BP: (76-125)/(57-104) 101/67 (09/01 0500) SpO2:  [78 %-100 %] 93 % (09/01 0740) FiO2 (%):  [50 %] 50 % (09/01 0200) Weight:  [83.1 kg-85.3 kg]  83.1 kg (09/01 0224)  Physical Examination:  Awake and oriented with no motor neurological deficits Tolerating BiPAP, no distress, able to talk in full sentences, bilateral equal air entry with no adventitious sounds S1 and S2 are audible with no murmur Benign abdominal exam with normal peristalsis Within normal extremities and no peripheral  edema.   ASSESSMENT / PLAN: Acute respiratory failure tolerating BiPAP -Monitor ABG, optimize BiPAP settings.  Consider intubation if no improvement  NSTEMI cardiogenic shock with diffuse ST depression on EKG. -Heparin drip + beta-blocker as tolerated + aspirin -Hemodynamic monitoring and consider vasopressors -Echocardiogram and monitor cardiac enzymes -Management as per cardiology and considering left heart cath   Atelectasis and pneumonia.  Bibasilar airspace disease with a small right pleural effusion -Empiric Rocephin and doxy.  Monitor CXR + CBC + FiO2  Anion gap metabolic acidosis possible sepsis versus mild DKA with new onset diabetes -Optimize sodium bicarb, glycemic control -Monitor anion gap, lactic acid and ABG  Chronic hyponatremia -Monitor renal panel  Full code  Supportive care  Critical care time 45 minutes

## 2017-09-04 NOTE — Consult Note (Signed)
ANTICOAGULATION CONSULT NOTE - Initial Consult  Pharmacy Consult for Heparin Drip  Indication: chest pain/ACS  Allergies  Allergen Reactions  . Amitriptyline     sedating  . Erythromycin     unknown  . Lopressor [Metoprolol Tartrate]     Fatigue/malaise  . Macrolides And Ketolides     Macrolide antibiotics  . Pamelor [Nortriptyline]     Unknown   . Penicillins   . Sulfa Antibiotics   . Ciprocin-Fluocin-Procin [Fluocinolone] Rash  . Flagyl [Metronidazole] Rash    Patient Measurements: Height: 5\' 1"  (154.9 cm) Weight: 188 lb (85.3 kg) IBW/kg (Calculated) : 47.8 Heparin Dosing Weight: 70 kg  Vital Signs: Temp: 98.3 F (36.8 C) (09/01 0015) Temp Source: Axillary (09/01 0015) BP: 131/55 (09/01 0015) Pulse Rate: 100 (09/01 0015)  Labs: Recent Labs    09/11/2017 0018  HGB 13.6  HCT 40.4  PLT 427    CrCl cannot be calculated (Patient's most recent lab result is older than the maximum 21 days allowed.).  Assessment: Pharmacy consulted for heparin drip dosing and monitoring in 72 yo female admitted with ACS/STEMI.   Goal of Therapy:  Heparin level 0.3-0.7 units/ml Monitor platelets by anticoagulation protocol: Yes   Plan:  Baseline labs ordered Heparin Dosing Wt: 70kg  Give 4000 units bolus x 1 Start heparin infusion at 850 units/hr Check anti-Xa level in 8 hours and daily while on heparin Continue to monitor H&H and platelets  Pernell Dupre, PharmD, BCPS Clinical Pharmacist 09/12/2017 12:51 AM

## 2017-09-04 NOTE — Progress Notes (Signed)
Pt's O2 sat's noted to be decreasing to the low 80's. Oxygen concentration on bi-pap increased to 60%. Sat's did not increase, increased bi-pap settings to 70%. After several minutes and no change in oxygen saturation, bi-pap increased to 80%. Pt's sat's increased to 94%. Respiratory notified of changes. Will continue to monitor and wean as tolerated.

## 2017-09-04 NOTE — H&P (Signed)
[place] [facility] [place] NAME: Marilyn [name]    MR#:  [id number]  DATE OF BIRTH:  February 27, 1945  DATE OF ADMISSION:  09/29/2017  PRIMARY CARE PHYSICIAN: Baxter Hire, MD   REQUESTING/REFERRING PHYSICIAN: Paulette Blanch, MD  CHIEF COMPLAINT:   Chief Complaint  Patient presents with  . Respiratory Distress    HISTORY OF PRESENT ILLNESS:  Marilyn [name]  is a 72 y.o. female with a known history of obesity, HTN, HLD, TIA p/w acute SOB, hypoxemic respiratory failure. Pt on BiPAP, majority of Hx obtained from pt's son at bedside. Pt has a documented Hx of LVH and moderate mitral insufficiency, and she follows w/ Dr. Nehemiah Massed of Cardiology as an outpt (last seen 07/21/2017). Pt's son tells me that pt does not have any Hx of CAD/MI or arrhythmia. He states that pt developed some CP early in the week (Monday 08/25). She saw her PCP (Dr. Edwina Barth) on 08/26 for epigastric pain/burning. Pt had a stress echo on 06/30/2017, (-) myocardial ischemia. Based on recent (-) testing, pt was treated for GERD w/ Rx for Nexium. Pt went home, but continued to have epigastric/chest discomfort, w/ continued constant pain. Throughout the week, pt's son tells me the pt has been noted to have intermittent episodes of SOB and anxiety. Her son has been checking on her regularly, and states that her symptoms had been stable up until Sat 09/03/2017 @~2200, when pt's CP and SOB became acutely worse. Pt was sitting in recliner, and son helped her lay down, but this worsened her dyspnea, (+) orthopnea. Pt endorses non-productive cough, nausea, dry-heaving, diaphoresis, fatigue/malaise/generalized weakness, decreased exercise tolerance, generalized body aches/myalgias and chronic low back pain. She denies leg swelling. She denies fever, chills and rigors. She does not have FHx (+) for CAD/MI in her parents, siblings or children. She used to smoke minimally, but quit ~54yrs ago. SpO2 as low as 78%  on room air, started on BiPAP in ED. CXR (+) interstitial edema, trace B/L pleural effusions. BNP 455. Trop-I 6.66, 9.17. ACS/NSTEMI + suspected acute decompensated systolic congestive heart failure. SIRS (+), leukocytosis (WBC 25.5), CXR does not exclude pneumonia.  PAST MEDICAL HISTORY:   Past Medical History:  Diagnosis Date  . Anemia   . Carpal tunnel syndrome   . Cataract cortical, senile   . Esophageal reflux   . Essential hypertension   . History of chicken pox   . Hyperlipidemia   . Mini stroke ([place])   . Mixed hyperlipidemia   . Osteoarthrosis, not specified whether generalized/localized, lower leg   . Seasonal allergies     PAST SURGICAL HISTORY:   Past Surgical History:  Procedure Laterality Date  . CATARACT EXTRACTION Bilateral    left 1989; right 1991  . COLONOSCOPY WITH PROPOFOL N/A 11/27/2014   Procedure: COLONOSCOPY WITH PROPOFOL;  Surgeon: Manya Silvas, MD;  Location: [facility] ENDOSCOPY;  Service: Endoscopy;  Laterality: N/A;  . COLONOSCOPY WITH PROPOFOL N/A 05/21/2016   Procedure: COLONOSCOPY WITH PROPOFOL;  Surgeon: Manya Silvas, MD;  Location: [facility] ENDOSCOPY;  Service: Endoscopy;  Laterality: N/A;  . EYE SURGERY    . KNEE ARTHROSCOPY W/ PARTIAL MEDIAL MENISCECTOMY Left 06/23/2004  . Makoplasty Left 07/15/2011  . SALPINGOOPHORECTOMY    . TOTAL ABDOMINAL HYSTERECTOMY      SOCIAL HISTORY:   Social History   Tobacco Use  . Smoking status: Former Research scientist (life sciences)  . Smokeless tobacco: Never Used  Substance Use Topics  . Alcohol use:  No    FAMILY HISTORY:   Family History  Problem Relation Age of Onset  . Breast cancer Paternal Aunt     DRUG ALLERGIES:   Allergies  Allergen Reactions  . Amitriptyline     sedating  . Erythromycin     unknown  . Lopressor [Metoprolol Tartrate]     Fatigue/malaise  . Macrolides And Ketolides     Macrolide antibiotics  . Pamelor [Nortriptyline]     Unknown   . Penicillins   . Sulfa Antibiotics   .  Ciprocin-Fluocin-Procin [Fluocinolone] Rash  . Flagyl [Metronidazole] Rash    REVIEW OF SYSTEMS:   Review of Systems  Constitutional: Positive for diaphoresis and malaise/fatigue. Negative for chills, fever and weight loss.  HENT: Negative for congestion, ear pain, hearing loss, nosebleeds, sinus pain, sore throat and tinnitus.   Eyes: Negative for blurred vision, double vision and photophobia.  Respiratory: Positive for cough and shortness of breath. Negative for hemoptysis, sputum production and wheezing.   Cardiovascular: Positive for chest pain and orthopnea. Negative for palpitations, claudication, leg swelling and PND.  Gastrointestinal: Positive for nausea. Negative for abdominal pain, blood in stool, constipation, diarrhea, heartburn, melena and vomiting.  Genitourinary: Negative for dysuria, frequency, hematuria and urgency.  Musculoskeletal: Positive for back pain and myalgias. Negative for joint pain and neck pain.  Skin: Negative for itching and rash.  Neurological: Positive for weakness. Negative for dizziness, tingling, tremors, sensory change, speech change, focal weakness, seizures, loss of consciousness and headaches.  Psychiatric/Behavioral: Negative for memory loss. The patient is nervous/anxious. The patient does not have insomnia.    MEDICATIONS AT HOME:   Prior to Admission medications   Medication Sig Start Date End Date Taking? Authorizing Provider  amLODipine (NORVASC) 2.5 MG tablet Take 2.5 mg by mouth daily.   Yes [provider]  diphenhydramine-acetaminophen (TYLENOL PM) 25-500 MG TABS tablet Take 1 tablet by mouth at bedtime as needed.   Yes [provider]  esomeprazole (NEXIUM) 40 MG capsule Take 40 mg by mouth daily. 08/29/17  Yes [provider]  fluconazole (DIFLUCAN) 150 MG tablet Take 150 mg by mouth daily. 08/28/17  Yes [provider]  fluticasone (FLONASE) 50 MCG/ACT nasal spray Place into both nostrils daily.   Yes  [provider]  LORazepam (ATIVAN) 0.5 MG tablet Take 1 tablet by mouth at bedtime as needed. 08/11/17  Yes [provider]  losartan (COZAAR) 50 MG tablet Take 50 mg by mouth 2 (two) times daily.   Yes [provider]  montelukast (SINGULAIR) 10 MG tablet Take 10 mg by mouth at bedtime.   Yes [provider]  Multiple Vitamins-Minerals (MULTIVITAMIN ADULT PO) Take 1 tablet by mouth daily.    Yes [provider]  omeprazole (PRILOSEC) 40 MG capsule Take 40 mg by mouth daily. 08/07/17  Yes [provider]  traZODone (DESYREL) 100 MG tablet Take 100 mg by mouth at bedtime. 07/06/17  Yes [provider]      VITAL SIGNS:  Blood pressure 100/68, pulse 86, temperature 99.5 F (37.5 C), temperature source Axillary, resp. rate (!) 33, height 5\' 1"  (1.549 m), weight 83.1 kg, SpO2 100 %.  PHYSICAL EXAMINATION:  Physical Exam  Constitutional: She is oriented to person, place, and time. She appears well-developed and well-nourished. She is active and cooperative.  Non-toxic appearance. She appears ill. She appears distressed (Moderate respiratory distress + anxiety). She is not intubated. Face mask in place.  HENT:  Head: Normocephalic and atraumatic.  Eyes: Conjunctivae, EOM and lids are normal. No scleral icterus.  Neck: Neck supple. No JVD present. No thyromegaly present.  Cardiovascular: Normal rate, regular rhythm, S1 normal and S2 normal.  No extrasystoles are present. Exam reveals no gallop, no S3, no S4, no distant heart sounds and no friction rub.  Murmur heard.  Systolic murmur is present with a grade of 2/6. Pulmonary/Chest: No accessory muscle usage or stridor. Tachypnea noted. No apnea and no bradypnea. She is not intubated. She is in respiratory distress. She has no decreased breath sounds. She has wheezes in the right lower field and the left lower field. She has no rhonchi. She has rales in the right lower field and the left lower  field.  Abdominal: Soft. Bowel sounds are normal. She exhibits no distension. There is no tenderness. There is no rigidity, no rebound and no guarding.  Musculoskeletal: Normal range of motion. She exhibits no edema or tenderness.  Lymphadenopathy:    She has no cervical adenopathy.  Neurological: She is alert and oriented to person, place, and time. She is not disoriented.  Skin: Skin is warm and intact. No rash noted. She is diaphoretic. No erythema.  Psychiatric: Her behavior is normal. Judgment and thought content normal. Her mood appears anxious. Cognition and memory are normal.   LABORATORY PANEL:   CBC Recent Labs  Lab 09/06/2017 0018  WBC 25.5*  HGB 13.6  HCT 40.4  PLT 427   ------------------------------------------------------------------------------------------------------------------  Chemistries  Recent Labs  Lab 09/15/2017 0018  NA 129*  K 5.0  CL 94*  CO2 19*  GLUCOSE 244*  BUN 20  CREATININE 0.90  CALCIUM 9.1  AST 126*  ALT 31  ALKPHOS 63  BILITOT 1.5*   ------------------------------------------------------------------------------------------------------------------  Cardiac Enzymes Recent Labs  Lab 09/19/2017 0134  TROPONINI 9.17*   ------------------------------------------------------------------------------------------------------------------  RADIOLOGY:  Dg Chest Port 1 View  Result Date: 09/06/2017 CLINICAL DATA:  72 year old female with respiratory distress and shortness of breath. EXAM: PORTABLE CHEST 1 VIEW COMPARISON:  None. FINDINGS: There is diffuse interstitial and central vascular prominence consistent with vascular congestion. Trace bilateral pleural effusions noted. Superimposed pneumonia is not excluded. No pneumothorax. The cardiac silhouette is within normal limits. No acute osseous pathology. IMPRESSION: Interstitial edema with trace bilateral pleural effusions. Findings may represent noncardiogenic pulmonary edema. Clinical  correlation is recommended. Electronically Signed   By: Anner Crete M.D.   On: 09/04/2017 00:51   IMPRESSION AND PLAN:   A/P: 84F p/w CP/SOB, acute hypoxemic respiratory failure. ACS/NSTEMI + suspected acute ischemic decompensated systolic congestive heart failure. SIRS (+), leukocytosis, possible sepsis 2/2 community acquired bacterial pneumonia. Hypochloremic hyponatremia, hyperglycemia, transaminasemia, hyperbilirubinemia. -CP, ACS/NSTEMI: Unstable angina, Trop-I 6.66, 9.17. EKG (+) diffuse ST depression, ST elevations in aVR + V1. Cardiologist Dr. Fletcher Anon contacted by ED provider, pt started on heparin gtt. Continuous cardiac monitoring. ASA, morphine, NTG, O2. D/C Norvasc, start Coreg (as tolerated). c/w ARB. Start statin. Cardiology consult. Will likely need cardiac catheterization.  -Acute hypoxemic respiratory failure, pulmonary edema, pleural effusions, BNP elevation, suspected acute ischemic decompensated systolic congestive heart failure: Pt w/ ACS/NSTEMI, as above. Suspect decompensated heart failure in setting of acute MI. (+) CP/SOB, orthopnea. (-) leg edema, (-) JVD. (+) bibasilar fine crackles + fluid wheeze. BNP 455. CXR (+) edema + effusions. Received Lasix in ED. Labwork suspicious for intravascular volume depletion (hypochloremic hyponatremia, BUN/Cr 20/0.90 = ~22). Pt not ordered for any further diuretics, nor is she ordered for IVF. BiPAP. ASA, beta blocker, ARB, statin (as above).  Echo pending. Pulse ox. I&O, daily weight, free water restriction for now. Cardiology consult. -SIRS, leukocytosis: WBC 25.5, tachypnea + hypoxia, SIRS (+). CXR does not exclude pneumonia. Possible sepsis, 2/2 acute bacterial community acquired pneumonia. Lactate, PCT, BCx, Sputum Cx/gram, UStrep + ULeg Ag pending. Ceftriaxone + Doxycycline. -Hyponatremia: Na+ 129, hypochloremic, suggestive of intravascular volume depletion. No IVF (discussed above). -Hyperglycemia: Glucose 244. No documented Hx DM.  HbA1c, Will Tx as means of cardiac risk factor modification if diagnostic. -Transaminasemia/hyperbilirubinemia: 2/2 ACS vs. dehydration/volume depletion vs. passive liver congestion. AST 126, slightly > 3x ULN. I am starting a Statin (Crestor) nonetheless. -c/w other home meds. -FEN/GI: NPO for now. -DVT PPx: Heparin gtt. -Code status: Full code. -Disposition: Admission, > 2 midnights.   All the records are reviewed and case discussed with ED provider. Management plans discussed with the patient, family and they are in agreement.  CODE STATUS: Full code  TOTAL TIME TAKING CARE OF THIS PATIENT: 90 minutes.    Arta Silence M.D on 09/20/2017 at 2:58 AM  Between 7am to 6pm - Pager - [phone]  After 6pm go to [email or web address] - Proofreader  [facility]  Office  [phone]  CC: Primary care physician; Baxter Hire, MD   Note: This dictation was prepared with Dragon dictation along with smaller phrase technology. Any transcriptional errors that result from this process are unintentional.

## 2017-09-04 NOTE — Progress Notes (Signed)
ANTICOAGULATION CONSULT NOTE - Initial Consult  Pharmacy Consult for Heparin  Indication: chest pain/ACS  Allergies  Allergen Reactions  . Amitriptyline     sedating  . Erythromycin     unknown  . Lopressor [Metoprolol Tartrate]     Fatigue/malaise  . Macrolides And Ketolides     Macrolide antibiotics  . Pamelor [Nortriptyline]     Unknown   . Penicillins   . Sulfa Antibiotics   . Ciprocin-Fluocin-Procin [Fluocinolone] Rash  . Flagyl [Metronidazole] Rash    Patient Measurements: Height: 5\' 1"  (154.9 cm) Weight: 183 lb 3.2 oz (83.1 kg) IBW/kg (Calculated) : 47.8 Heparin Dosing Weight:   Vital Signs: Temp: 100.3 F (37.9 C) (09/01 1600) Temp Source: Axillary (09/01 1600) BP: 94/67 (09/01 1900) Pulse Rate: 99 (09/01 1900)  Labs: Recent Labs    09/27/2017 0018 09/14/2017 0134 09/13/2017 1129 10/02/2017 1832  HGB 13.6  --  12.0  --   HCT 40.4  --  34.4*  --   PLT 427  --  329  --   APTT  --   --  88*  --   LABPROT  --   --  14.0  --   INR  --   --  1.09  --   HEPARINUNFRC  --   --  0.71* 0.56  CREATININE 0.90  --  1.91*  --   TROPONINI 6.66* 9.17* 14.85*  --     Estimated Creatinine Clearance: 25.6 mL/min (A) (by C-G formula based on SCr of 1.91 mg/dL (H)).   Medical History: Past Medical History:  Diagnosis Date  . Anemia   . Carpal tunnel syndrome   . Cataract cortical, senile   . Esophageal reflux   . Essential hypertension   . History of chicken pox   . Hyperlipidemia   . Mini stroke ([place])   . Mixed hyperlipidemia   . Osteoarthrosis, not specified whether generalized/localized, lower leg   . Seasonal allergies     Medications:  Medications Prior to Admission  Medication Sig Dispense Refill Last Dose  . amLODipine (NORVASC) 2.5 MG tablet Take 2.5 mg by mouth daily.   unknown at unknown  . diphenhydramine-acetaminophen (TYLENOL PM) 25-500 MG TABS tablet Take 1 tablet by mouth at bedtime as needed.   prn at prn  . esomeprazole (NEXIUM) 40 MG capsule  Take 40 mg by mouth daily.  11 unknown at unknown  . fluconazole (DIFLUCAN) 150 MG tablet Take 150 mg by mouth daily.  0 unknown at unknown  . fluticasone (FLONASE) 50 MCG/ACT nasal spray Place into both nostrils daily.   unknown at unknown  . LORazepam (ATIVAN) 0.5 MG tablet Take 1 tablet by mouth at bedtime as needed.  0 prn at prn  . losartan (COZAAR) 50 MG tablet Take 50 mg by mouth 2 (two) times daily.   unknown at unknown  . montelukast (SINGULAIR) 10 MG tablet Take 10 mg by mouth at bedtime.   unknown at unknown  . Multiple Vitamins-Minerals (MULTIVITAMIN ADULT PO) Take 1 tablet by mouth daily.    unknown at unknown  . omeprazole (PRILOSEC) 40 MG capsule Take 40 mg by mouth daily.   unknown at unknown  . traZODone (DESYREL) 100 MG tablet Take 100 mg by mouth at bedtime.  0 unknown at unknown   Assessment: Pharmacy consulted for heparin drip dosing and monitoring in 72 yo female admitted with ACS/STEMI.  HEPARIN COURSE DATE   TIME    HL  CHANGE  9/1       0107    --          Initiate heparin infusion 4000 units IV x 1 bolus and 850 unit/hr rate 9/1       1129    0.71     No change due to line draw/difficult stick - recheck in 6 hours instead of 8   Goal of Therapy:  Heparin level 0.3-0.7 units/ml Monitor platelets by anticoagulation protocol: Yes   Plan:  HL slightly supratherapeutic at 0.71 but difficult stick and lab has been drawn from line. APTT is WNL. We will not change the rate at this time but will check a HL in 6 hours instead of 8 to make sure level is not supratherapeutic.  9/1:  HL @ 18:30 = 0.56 Will continue pt on current rate and draw confirmation level on 9/2 @ 0300.   Dionisios Ricci D 09/11/2017,7:22 PM

## 2017-09-04 NOTE — Consult Note (Addendum)
Cardiology Consultation Note    Patient ID: Marilyn [name], MRN: [id number], DOB/AGE: 1945/05/30 72 y.o. Admit date: 09/27/2017   Date of Consult: 10/03/2017 Primary Physician: Baxter Hire, MD Primary Cardiologist: Dr. Nehemiah Massed  Chief Complaint: sob Reason for Consultation: nstemi Requesting MD: Dr. Bridgett Larsson  HPI: Marilyn [name] is a 72 y.o. female with history of hypertension, mild MR mild TR, hyperlipidemia with an LDL of 169 who was admitted with acute onset of shortness of breath.  She was recently evaluated noninvasively with an exercise stress echo as an outpatient revealed normal LV function with no evidence of ischemia with fairly good exercise tolerance.  She had mild MR mild TR.  She apparently had no history of coronary artery disease or myocardial infarction.  She had some chest pain per her family's report earlier this week.  She was placed on therapy for probable gastritis.  Patient states that she continued to have intermittent episodes of episodic shortness of breath and anxiety.  Is worsened and she presented to the emergency room where she was noted to be hypoxic.  Chest x-ray showed interstitial edema with trace bilateral pleural effusions.  Her pulse oximetry was 78%.  Her troponin was 6.66 and her BNP was 455.  The troponin increased to 9.17.  She appears to have ruled in for a non-ST elevation myocardial infarction.  She has leukocytosis with a white blood cell count of 25.  EKG showed global ST-T wave depression.  There was no injury current.  She did not meet STEMI criteria.  She is currently pain-free breathing with BiPAP.  She is currently on IV heparin, losartan 50 mg twice daily, carvedilol 3.125 mg twice daily, Nitropaste.  Past Medical History:  Diagnosis Date  . Anemia   . Carpal tunnel syndrome   . Cataract cortical, senile   . Esophageal reflux   . Essential hypertension   . History of chicken pox   . Hyperlipidemia   . Mini stroke ([place])   . Mixed  hyperlipidemia   . Osteoarthrosis, not specified whether generalized/localized, lower leg   . Seasonal allergies       Surgical History:  Past Surgical History:  Procedure Laterality Date  . CATARACT EXTRACTION Bilateral    left 1989; right 1991  . COLONOSCOPY WITH PROPOFOL N/A 11/27/2014   Procedure: COLONOSCOPY WITH PROPOFOL;  Surgeon: Manya Silvas, MD;  Location: [facility] ENDOSCOPY;  Service: Endoscopy;  Laterality: N/A;  . COLONOSCOPY WITH PROPOFOL N/A 05/21/2016   Procedure: COLONOSCOPY WITH PROPOFOL;  Surgeon: Manya Silvas, MD;  Location: [facility] ENDOSCOPY;  Service: Endoscopy;  Laterality: N/A;  . EYE SURGERY    . KNEE ARTHROSCOPY W/ PARTIAL MEDIAL MENISCECTOMY Left 06/23/2004  . Makoplasty Left 07/15/2011  . SALPINGOOPHORECTOMY    . TOTAL ABDOMINAL HYSTERECTOMY       Home Meds: Prior to Admission medications   Medication Sig Start Date End Date Taking? Authorizing Provider  amLODipine (NORVASC) 2.5 MG tablet Take 2.5 mg by mouth daily.   Yes [provider]  diphenhydramine-acetaminophen (TYLENOL PM) 25-500 MG TABS tablet Take 1 tablet by mouth at bedtime as needed.   Yes [provider]  esomeprazole (NEXIUM) 40 MG capsule Take 40 mg by mouth daily. 08/29/17  Yes [provider]  fluconazole (DIFLUCAN) 150 MG tablet Take 150 mg by mouth daily. 08/28/17  Yes [provider]  fluticasone (FLONASE) 50 MCG/ACT nasal spray Place into both nostrils daily.   Yes [provider]  LORazepam (ATIVAN)  0.5 MG tablet Take 1 tablet by mouth at bedtime as needed. 08/11/17  Yes [provider]  losartan (COZAAR) 50 MG tablet Take 50 mg by mouth 2 (two) times daily.   Yes [provider]  montelukast (SINGULAIR) 10 MG tablet Take 10 mg by mouth at bedtime.   Yes [provider]  Multiple Vitamins-Minerals (MULTIVITAMIN ADULT PO) Take 1 tablet by mouth daily.    Yes [provider]  omeprazole (PRILOSEC) 40 MG  capsule Take 40 mg by mouth daily. 08/07/17  Yes [provider]  traZODone (DESYREL) 100 MG tablet Take 100 mg by mouth at bedtime. 07/06/17  Yes [provider]    Inpatient Medications:  . aspirin  324 mg Oral Once  . [START ON Sep 07, 2017] aspirin EC  81 mg Oral Daily  . carvedilol  3.125 mg Oral BID WC  . losartan  50 mg Oral BID  . montelukast  10 mg Oral QHS  . rosuvastatin  40 mg Oral q1800   . cefTRIAXone (ROCEPHIN)  IV 1 g (09/07/2017 0148)  . doxycycline (VIBRAMYCIN) IV 100 mg (09/19/2017 0530)  . heparin 850 Units/hr (09/16/2017 0107)    Allergies:  Allergies  Allergen Reactions  . Amitriptyline     sedating  . Erythromycin     unknown  . Lopressor [Metoprolol Tartrate]     Fatigue/malaise  . Macrolides And Ketolides     Macrolide antibiotics  . Pamelor [Nortriptyline]     Unknown   . Penicillins   . Sulfa Antibiotics   . Ciprocin-Fluocin-Procin [Fluocinolone] Rash  . Flagyl [Metronidazole] Rash    Social History   Socioeconomic History  . Marital status: Widowed    Spouse name: Not on file  . Number of children: Not on file  . Years of education: Not on file  . Highest education level: Not on file  Occupational History  . Not on file  Social Needs  . Financial resource strain: Not on file  . Food insecurity:    Worry: Not on file    Inability: Not on file  . Transportation needs:    Medical: Not on file    Non-medical: Not on file  Tobacco Use  . Smoking status: Former Research scientist (life sciences)  . Smokeless tobacco: Never Used  Substance and Sexual Activity  . Alcohol use: No  . Drug use: No  . Sexual activity: Not on file  Lifestyle  . Physical activity:    Days per week: Not on file    Minutes per session: Not on file  . Stress: Not on file  Relationships  . Social connections:    Talks on phone: Not on file    Gets together: Not on file    Attends religious service: Not on file    Active member of club or organization: Not on file    Attends  meetings of clubs or organizations: Not on file    Relationship status: Not on file  . Intimate partner violence:    Fear of current or ex partner: Not on file    Emotionally abused: Not on file    Physically abused: Not on file    Forced sexual activity: Not on file  Other Topics Concern  . Not on file  Social History Narrative  . Not on file     Family History  Problem Relation Age of Onset  . Breast cancer Paternal Aunt      Review of Systems: A 12-system review of systems was  performed and is negative except as noted in the HPI.  Labs: Recent Labs    09/14/2017 0018 09/25/2017 0134  TROPONINI 6.66* 9.17*   Lab Results  Component Value Date   WBC 25.5 (H) 10/01/2017   HGB 13.6 09/18/2017   HCT 40.4 10/03/2017   MCV 89.2 09/22/2017   PLT 427 09/18/2017    Recent Labs  Lab 09/12/2017 0018  NA 129*  K 5.0  CL 94*  CO2 19*  BUN 20  CREATININE 0.90  CALCIUM 9.1  PROT 7.2  BILITOT 1.5*  ALKPHOS 63  ALT 31  AST 126*  GLUCOSE 244*   Lab Results  Component Value Date   CHOL 268 (H) 06/08/2010   HDL 61 06/08/2010   LDLCALC (H) 06/08/2010    161        Total Cholesterol/HDL:CHD Risk Coronary Heart Disease Risk Table                     Men   Women  1/2 Average Risk   3.4   3.3  Average Risk       5.0   4.4  2 X Average Risk   9.6   7.1  3 X Average Risk  23.4   11.0        Use the calculated Patient Ratio above and the CHD Risk Table to determine the patient's CHD Risk.        ATP III CLASSIFICATION (LDL):  <100     mg/dL   Optimal  100-129  mg/dL   Near or Above                    Optimal  130-159  mg/dL   Borderline  160-189  mg/dL   High  >190     mg/dL   Very High   TRIG 230 (H) 06/08/2010   No results found for: DDIMER  Radiology/Studies:  Dg Chest Port 1 View  Result Date: 10/01/2017 CLINICAL DATA:  72 year old female with respiratory distress and shortness of breath. EXAM: PORTABLE CHEST 1 VIEW COMPARISON:  None. FINDINGS: There is diffuse  interstitial and central vascular prominence consistent with vascular congestion. Trace bilateral pleural effusions noted. Superimposed pneumonia is not excluded. No pneumothorax. The cardiac silhouette is within normal limits. No acute osseous pathology. IMPRESSION: Interstitial edema with trace bilateral pleural effusions. Findings may represent noncardiogenic pulmonary edema. Clinical correlation is recommended. Electronically Signed   By: Anner Crete M.D.   On: 09/30/2017 00:51   [place] Ekg Site Rite  Result Date: 09/27/2017 If Site Rite image not attached, placement could not be confirmed due to current cardiac rhythm.   Wt Readings from Last 3 Encounters:  09/07/2017 83.1 kg  05/21/16 81.6 kg  11/27/14 74.8 kg    EKG: Sinus rhythm with global ST depression.  Physical Exam: Occasional female on BiPAP Blood pressure 101/67, pulse 86, temperature 99.5 F (37.5 C), temperature source Axillary, resp. rate (!) 28, height 5\' 1"  (1.549 m), weight 83.1 kg, SpO2 93 %. Body mass index is 34.62 kg/m. General: Well developed, well nourished, in no acute distress. Head: Normocephalic, atraumatic, sclera non-icteric, no xanthomas, nares are without discharge.  Neck: Negative for carotid bruits. JVD not elevated. Lungs: Bilateral rhonchi.  Crackles bilaterally. Heart: RRR with S1 S2. No murmurs, rubs, or gallops appreciated.  Abdomen: Soft, non-tender, non-distended with normoactive bowel sounds. No hepatomegaly. No rebound/guarding. No obvious abdominal masses. Msk:  Strength and tone appear normal for age.  Extremities: No clubbing or cyanosis. No edema.  Distal pedal pulses are 2+ and equal bilaterally. Neuro: Alert and oriented X 3. No facial asymmetry. No focal deficit. Moves all extremities spontaneously. Psych:  Responds to questions appropriately with a normal affect.     Assessment and Plan  72 year old female with no prior ischemic cardiac history with history of hyperlipidemia  hypertension and previous tobacco abuse admitted with acute onset respiratory distress.  Cardiac markers elevated at 6.66 on presentation increased to 9.17.  Further evaluate is pending.  EKG showed diffuse ST depression.  No injury current.  Patient is currently pain-free but relatively hypotensive.  On BiPAP.  We will continue with heparin, low-dose beta-blocker.  Will reduce losartan to 50 mg daily.  May need to discontinue based on blood pressure.  Nitrates as tolerated.  We will proceed with an echocardiogram to evaluate LV function and compared to previous echo done approximately 2 months ago suggesting preserved LV function with mild MR.  Would continue to improve respiratory status.  Will follow symptoms, EKG and consider proceeding with left heart cath when stable.  Urgent cath will be considered if clinical course changes.   Signed, Teodoro Spray MD 09/28/2017, 9:10 AM Pager: [phone]

## 2017-09-04 NOTE — Progress Notes (Signed)
Spoke with Rn re PICC order.  States still needs the PICC line due to 24G PIV's requiring multiple sticks to obtain.  RN to notify CVW to place PICC.

## 2017-09-04 NOTE — ED Notes (Signed)
hospitalist in to see pt.

## 2017-09-04 NOTE — ED Triage Notes (Signed)
Pt arrives in resp distress. md at bedside.

## 2017-09-05 ENCOUNTER — Inpatient Hospital Stay: Payer: Medicare Other

## 2017-09-05 LAB — CBC WITH DIFFERENTIAL/PLATELET
BASOS ABS: 0.1 10*3/uL (ref 0–0.1)
Basophils Relative: 0 %
EOS PCT: 0 %
Eosinophils Absolute: 0 10*3/uL (ref 0–0.7)
HEMATOCRIT: 30.5 % — AB (ref 35.0–47.0)
Hemoglobin: 10.4 g/dL — ABNORMAL LOW (ref 12.0–16.0)
LYMPHS PCT: 12 %
Lymphs Abs: 2.9 10*3/uL (ref 1.0–3.6)
MCH: 30.9 pg (ref 26.0–34.0)
MCHC: 34.1 g/dL (ref 32.0–36.0)
MCV: 90.6 fL (ref 80.0–100.0)
MONO ABS: 1.9 10*3/uL — AB (ref 0.2–0.9)
MONOS PCT: 8 %
NEUTROS ABS: 19.8 10*3/uL — AB (ref 1.4–6.5)
Neutrophils Relative %: 80 %
PLATELETS: 337 10*3/uL (ref 150–440)
RBC: 3.37 MIL/uL — ABNORMAL LOW (ref 3.80–5.20)
RDW: 13.2 % (ref 11.5–14.5)
WBC: 24.7 10*3/uL — ABNORMAL HIGH (ref 3.6–11.0)

## 2017-09-05 LAB — GLUCOSE, CAPILLARY
GLUCOSE-CAPILLARY: 207 mg/dL — AB (ref 70–99)
Glucose-Capillary: 154 mg/dL — ABNORMAL HIGH (ref 70–99)
Glucose-Capillary: 182 mg/dL — ABNORMAL HIGH (ref 70–99)

## 2017-09-05 LAB — BASIC METABOLIC PANEL
Anion gap: 11 (ref 5–15)
BUN: 29 mg/dL — ABNORMAL HIGH (ref 8–23)
CALCIUM: 7 mg/dL — AB (ref 8.9–10.3)
CO2: 20 mmol/L — ABNORMAL LOW (ref 22–32)
Chloride: 106 mmol/L (ref 98–111)
Creatinine, Ser: 2.07 mg/dL — ABNORMAL HIGH (ref 0.44–1.00)
GFR calc Af Amer: 26 mL/min — ABNORMAL LOW (ref >60)
GFR, EST NON AFRICAN AMERICAN: 23 mL/min — AB (ref >60)
Glucose, Bld: 175 mg/dL — ABNORMAL HIGH (ref 70–99)
POTASSIUM: 3.6 mmol/L (ref 3.5–5.1)
SODIUM: 137 mmol/L (ref 135–145)

## 2017-09-05 LAB — BLOOD GAS, ARTERIAL
Acid-base deficit: 9.5 mmol/L — ABNORMAL HIGH (ref 0.0–2.0)
BICARBONATE: 17.6 mmol/L — AB (ref 20.0–28.0)
DELIVERY SYSTEMS: POSITIVE
FIO2: 0.4
O2 Saturation: 97.4 %
PATIENT TEMPERATURE: 37
PCO2 ART: 42 mmHg (ref 32.0–48.0)
PH ART: 7.23 — AB (ref 7.350–7.450)
PO2 ART: 112 mmHg — AB (ref 83.0–108.0)

## 2017-09-05 LAB — HEPARIN LEVEL (UNFRACTIONATED)
Heparin Unfractionated: 0.56 IU/mL (ref 0.30–0.70)
Heparin Unfractionated: 0.63 IU/mL (ref 0.30–0.70)

## 2017-09-05 LAB — PHOSPHORUS: Phosphorus: 5.5 mg/dL — ABNORMAL HIGH (ref 2.5–4.6)

## 2017-09-05 LAB — HIV ANTIBODY (ROUTINE TESTING W REFLEX): HIV SCREEN 4TH GENERATION: NONREACTIVE

## 2017-09-05 LAB — PROCALCITONIN: PROCALCITONIN: 0.33 ng/mL

## 2017-09-05 LAB — MAGNESIUM: Magnesium: 1.5 mg/dL — ABNORMAL LOW (ref 1.7–2.4)

## 2017-09-05 MED ORDER — NOREPINEPHRINE 4 MG/250ML-% IV SOLN
0.0000 ug/min | INTRAVENOUS | Status: DC
  Administered 2017-09-05: 10 ug/min via INTRAVENOUS
  Filled 2017-09-05: qty 250

## 2017-09-05 MED ORDER — NOREPINEPHRINE 16 MG/250ML-% IV SOLN
0.0000 ug/min | INTRAVENOUS | Status: DC
  Administered 2017-09-05: 2 ug/min via INTRAVENOUS
  Filled 2017-09-05: qty 250

## 2017-09-05 MED ORDER — MORPHINE 100MG IN NS 100ML (1MG/ML) PREMIX INFUSION
10.0000 mg/h | INTRAVENOUS | Status: DC
  Administered 2017-09-05: 10 mg/h via INTRAVENOUS
  Filled 2017-09-05: qty 100

## 2017-09-05 MED ORDER — EPINEPHRINE PF 1 MG/ML IJ SOLN
0.5000 ug/min | INTRAVENOUS | Status: DC
  Filled 2017-09-05: qty 4

## 2017-09-05 MED ORDER — ATROPINE SULFATE 1 MG/ML IJ SOLN
1.0000 mg | Freq: Once | INTRAMUSCULAR | Status: AC
  Filled 2017-09-05: qty 1

## 2017-09-05 MED ORDER — NITROGLYCERIN 2 % TD OINT
0.5000 [in_us] | TOPICAL_OINTMENT | Freq: Four times a day (QID) | TRANSDERMAL | Status: DC
  Administered 2017-09-05: 0.5 [in_us] via TOPICAL
  Filled 2017-09-05: qty 1

## 2017-09-05 MED ORDER — VASOPRESSIN 20 UNIT/ML IV SOLN
0.0300 [IU]/min | INTRAVENOUS | Status: DC
  Administered 2017-09-05: 0.03 [IU]/min via INTRAVENOUS
  Filled 2017-09-05: qty 2

## 2017-09-05 MED ORDER — ETOMIDATE 2 MG/ML IV SOLN
20.0000 mg | Freq: Once | INTRAVENOUS | Status: AC
  Administered 2017-09-05: 20 mg via INTRAVENOUS

## 2017-09-05 MED ORDER — SODIUM CHLORIDE 0.9 % IV BOLUS
1000.0000 mL | Freq: Once | INTRAVENOUS | Status: AC
  Administered 2017-09-05: 1000 mL via INTRAVENOUS

## 2017-09-05 MED ORDER — SODIUM BICARBONATE 8.4 % IV SOLN
50.0000 meq | Freq: Once | INTRAVENOUS | Status: AC
  Administered 2017-09-05: 50 meq via INTRAVENOUS

## 2017-09-05 MED ORDER — FENTANYL 2500MCG IN NS 250ML (10MCG/ML) PREMIX INFUSION
0.0000 ug/h | INTRAVENOUS | Status: DC
  Administered 2017-09-05: 100 ug/h via INTRAVENOUS

## 2017-09-05 MED ORDER — MAGNESIUM SULFATE 2 GM/50ML IV SOLN
2.0000 g | Freq: Once | INTRAVENOUS | Status: AC
  Administered 2017-09-05: 2 g via INTRAVENOUS

## 2017-09-05 MED ORDER — ATROPINE SULFATE 1 MG/10ML IJ SOSY
PREFILLED_SYRINGE | INTRAMUSCULAR | Status: AC
  Administered 2017-09-05: 1 mg
  Filled 2017-09-05: qty 10

## 2017-09-05 MED ORDER — FENTANYL 2500MCG IN NS 250ML (10MCG/ML) PREMIX INFUSION
INTRAVENOUS | Status: AC
  Administered 2017-09-05: 100 ug/h via INTRAVENOUS
  Filled 2017-09-05: qty 250

## 2017-09-05 MED ORDER — ETOMIDATE 2 MG/ML IV SOLN
INTRAVENOUS | Status: AC
  Administered 2017-09-05: 20 mg via INTRAVENOUS
  Filled 2017-09-05: qty 10

## 2017-09-06 LAB — LEGIONELLA PNEUMOPHILA SEROGP 1 UR AG: L. pneumophila Serogp 1 Ur Ag: NEGATIVE

## 2017-09-06 LAB — GLUCOSE, CAPILLARY: Glucose-Capillary: 190 mg/dL — ABNORMAL HIGH (ref 70–99)

## 2017-09-07 ENCOUNTER — Telehealth: Payer: Self-pay

## 2017-09-07 LAB — CALCIUM, IONIZED: CALCIUM, IONIZED, SERUM: 3.7 mg/dL — AB (ref 4.5–5.6)

## 2017-09-07 NOTE — Telephone Encounter (Signed)
Marilyn [name] dropped off Death Certificate to be completed and signed Placed in nurse box

## 2017-09-07 NOTE — Telephone Encounter (Signed)
Death certificate placed in MD folder for completion. DS

## 2017-09-08 NOTE — Telephone Encounter (Signed)
Informed funeral home death cert is ready for pick up.

## 2017-09-09 LAB — CULTURE, BLOOD (ROUTINE X 2)
CULTURE: NO GROWTH
Culture: NO GROWTH

## 2017-10-04 NOTE — Progress Notes (Signed)
Pt's respiratory status declined this morning and pt required intubation at around 0830. Shortly after intubation, pt became hypotensive and bradycardic. Pt on levophed and neo-synephrine, both at the maximum dosage. Pt given one amp atropine and Dr. Soyla Murphy notified. Per Dr. Artist Beach orders, pt given one amp bi-carb and 2G mag; orders also placed for a vasopressin gtt.

## 2017-10-04 NOTE — Progress Notes (Signed)
Patient Name: Marilyn [name] Date of Encounter: 23-Sep-2017  Hospital Problem List     Active Problems:   Acute hypoxemic respiratory failure [facility])    Patient Profile     72 year old female admitted with acute on chronic respiratory distress.  Initial serum troponin was 6.66.  Increased to 14.85.  Last 24 hours complicated by worsening respiratory failure.  Patient intubated.  Currently requiring pressors.  Chest x-ray shows right lower lobe infiltrate.  Subjective   Intubated and sedated on pressors.  Inpatient Medications    . aspirin  324 mg Oral Once  . aspirin EC  81 mg Oral Daily  . carvedilol  3.125 mg Oral BID WC  . chlorhexidine  15 mL Mouth Rinse BID  . insulin aspart  0-9 Units Subcutaneous TID WC  . losartan  50 mg Oral Daily  . mouth rinse  15 mL Mouth Rinse q12n4p  . montelukast  10 mg Oral QHS  . nitroGLYCERIN  0.5 inch Topical Q6H  . rosuvastatin  40 mg Oral q1800    Vital Signs    Vitals:   2017-09-23 0515 09-23-2017 0530 Sep 23, 2017 0545 September 23, 2017 0600  BP: (!) 97/58 96/81 (!) 98/57 (!) 94/50  Pulse: 97 89 97 93  Resp: 15 16 17 17   Temp:      TempSrc:      SpO2: (!) 89% 91% 90% 90%  Weight:      Height:        Intake/Output Summary (Last 24 hours) at 2017-09-23 0847 Last data filed at 2017-09-23 0600 Gross per 24 hour  Intake 2666.5 ml  Output 175 ml  Net 2491.5 ml   Filed Weights   09/29/2017 0015 09/18/2017 0224 2017/09/23 0313  Weight: 85.3 kg 83.1 kg 83.7 kg    Physical Exam    GEN: Intubated. HEENT: normal.  Neck: Supple, no JVD, carotid bruits, or masses. Cardiac: RRR, 1-2 systolic murmur radiating to left lower sternal border  Respiratory: Breathing with ventilator. GI: Soft, nontender, nondistended, BS + x 4. MS: no deformity or atrophy. Skin: warm and dry, no rash. Neuro: Intubated   Labs    CBC Recent Labs    09/27/2017 0018 09/18/2017 1129 23-Sep-2017 0302  WBC 25.5* 14.8* 24.7*  NEUTROABS 18.2*  --  19.8*  HGB 13.6 12.0 10.4*  HCT  40.4 34.4* 30.5*  MCV 89.2 88.0 90.6  PLT 427 329 607   Basic Metabolic Panel Recent Labs    09/21/2017 2036 09/23/17 0256 September 23, 2017 0302  NA 135 137  --   K 3.9 3.6  --   CL 101 106  --   CO2 24 20*  --   GLUCOSE 148* 175*  --   BUN 28* 29*  --   CREATININE 1.67* 2.07*  --   CALCIUM 7.9* 7.0*  --   MG  --   --  1.5*  PHOS  --   --  5.5*   Liver Function Tests Recent Labs    09/19/2017 0018  AST 126*  ALT 31  ALKPHOS 63  BILITOT 1.5*  PROT 7.2  ALBUMIN 4.0   No results for input(s): LIPASE, AMYLASE in the last 72 hours. Cardiac Enzymes Recent Labs    10/03/2017 0018 10/01/2017 0134 09/17/2017 1129  TROPONINI 6.66* 9.17* 14.85*   BNP Recent Labs    09/29/2017 0018  BNP 455.0*   D-Dimer No results for input(s): DDIMER in the last 72 hours. Hemoglobin A1C Recent Labs    09/04/17 1129  HGBA1C 6.3*   Fasting Lipid Panel No results for input(s): CHOL, HDL, LDLCALC, TRIG, CHOLHDL, LDLDIRECT in the last 72 hours. Thyroid Function Tests No results for input(s): TSH, T4TOTAL, T3FREE, THYROIDAB in the last 72 hours.  Invalid input(s): FREET3  Telemetry    Sinus tachycardia  ECG    Sinus rhythm with interventricular conduction delay with inferolateral ST depression.  Radiology    Dg Chest Port 1 View  Result Date: 09/21/2017 CLINICAL DATA:  Pneumonia EXAM: PORTABLE CHEST 1 VIEW COMPARISON:  09/07/2017 FINDINGS: Slight worsening asymmetric airspace process throughout the right lung but also the left hilum and left lower lobe compatible with multilobar pneumonia and/or asymmetric edema. Heart is enlarged. Small pleural effusions noted as before. No pneumothorax. Trachea is midline. Aorta is atherosclerotic. Right PICC line tip mid SVC level. IMPRESSION: Slight worsening asymmetric airspace process compatible with multilobar pneumonia versus asymmetric edema. Persistent small effusions Electronically Signed   By: Jerilynn Mages.  Shick M.D.   On: Sep 21, 2017 08:42   Dg Chest Port 1  View  Result Date: 09/23/2017 CLINICAL DATA:  72 year old female with respiratory distress and shortness of breath. EXAM: PORTABLE CHEST 1 VIEW COMPARISON:  None. FINDINGS: There is diffuse interstitial and central vascular prominence consistent with vascular congestion. Trace bilateral pleural effusions noted. Superimposed pneumonia is not excluded. No pneumothorax. The cardiac silhouette is within normal limits. No acute osseous pathology. IMPRESSION: Interstitial edema with trace bilateral pleural effusions. Findings may represent noncardiogenic pulmonary edema. Clinical correlation is recommended. Electronically Signed   By: Anner Crete M.D.   On: 09/06/2017 00:51   [place] Ekg Site Rite  Result Date: 09/29/2017 If Site Rite image not attached, placement could not be confirmed due to current cardiac rhythm.   Assessment & Plan    72 year old female admitted with progressive respiratory failure.  Chest x-ray shows worsening asymmetric airspace disease consistent with multilobar pneumonia versus asymmetric edema predominantly on the right.  Patient ruled in for non-ST elevation myocardial infarction.  Currently intubated.  Patient spiked a temperature to 102 overnight.  Acquired intubation this morning.  Also on norepinephrine and phenylephrine.  Poor prognosis given multisystem compromise.  Coronary artery disease-patient ruled in for non-ST elevation myocardial infarction.  Likely has significant three-vessel coronary disease.  Event prompting admission is unclear.  May have been initial ischemic event however clinical course currently suggest possible airspace disease causing increased demand.  Electrocardiogram showed no injury current.  Will need invasive evaluation when stable hemodynamically, from a respiratory standpoint and infectious process is under control.  Continue with empiric antibiotics.  We will continue with heparin.  Due to hypotension will need to discontinue losartan, nitrates and  beta-blockers.  Echo showed inferolateral hypokinesis with fairly well preserved LV function otherwise.  Moderate MR.  This may be functional however exercise stress echo done in June 2019 revealed at least mild mitral regurgitation.    Pneumonia-chest x-ray suggests worsening airspace disease versus atypical asymmetrical pulmonary edema.  Patient spiked a temperature to 102 last p.m.  On empiric antibiotics.  Will need to aggressively treat and follow.  Renal insufficiency-patient currently has acute renal insufficiency.  Creatinine on presentation was 0.9 with a GFR greater than 60.  Currently creatinine is 2.07 with a GFR of 23.  Urine output has decreased.  Patient is plus approximately 4 L since admission.  May need nephrology assistance however likely this is secondary to hypotension and reduced renal perfusion on pressors.    Signed, Javier Docker Ethal Gotay MD 21-Sep-2017, 8:47 AM  Pager: [phone]

## 2017-10-04 NOTE — Progress Notes (Signed)
Pt. Extubated.

## 2017-10-04 NOTE — Progress Notes (Signed)
   10/04/2017 1045  Clinical Encounter Type  Visited With Family  Visit Type Follow-up   Chaplain attempted follow up with family; family requested time alone with patient at present.  Chaplain encouraged family to reach out as needed.

## 2017-10-04 NOTE — Progress Notes (Signed)
Time of death 81

## 2017-10-04 NOTE — Progress Notes (Signed)
Per Dr. Soyla Murphy, OG tube ok to use.

## 2017-10-04 NOTE — Progress Notes (Signed)
[facility] Progress Note Patient Name: Marilyn [name] DOB: 25-Sep-1945 MRN: [id number]   Date of Service  17-Sep-2017  HPI/Events of Note  Spoke with patient's son, Marilyn [name], who relates that his mother feels very strongly that she would not want intubation/mechanical ventilation, CPR, ACLS Drugs or Defribrillation in the event that her heart stopped.   eICU Interventions  Will order: 1. Partial Code Status - patient only wants NIV which she is already on.  2. Norepinephrine IV infusion. Titrate to MAP > 65.  3. ABG STAT.        [name] Eugene 09-17-2017, 2:04 AM

## 2017-10-04 NOTE — Progress Notes (Signed)
   09-22-17 0500  Clinical Encounter Type  Visited With Patient and family together  Visit Type Initial;Spiritual support  Recommendations Follow-up as requested.  Spiritual Encounters  Spiritual Needs Prayer   Chaplain prayed for the patient and family member on morning prayer rounds.

## 2017-10-04 NOTE — Progress Notes (Signed)
   10-01-2017 1030  Clinical Encounter Type  Visited With Patient not available;Health care provider  Visit Type Follow-up  Spiritual Encounters  Spiritual Needs Prayer   Based on on-call chaplain request, unit chaplain attempted follow up with patient and family.  Physicians speaking with family at present.  Chaplain offered silent and energetic prayers for patient, family and care team then spoke with patient nurse.

## 2017-10-04 NOTE — Progress Notes (Signed)
Discussed patient at length with her son and other family members.  Poor prognosis.  Patient on high-dose pressors, renal function declining.  Febrile to 102 last night.  Very poor prognosis.  Patient's family have discussed and patient regarding further aggressive treatment.  DNR status was discussed.  I think this is a reasonable approach given multisystem failure.  Not a candidate for invasive evaluation at present.  Continue with current aggressive treatment including ventilatory support, pressor support with current drugs.  Will follow renal function.  Continue with empiric antibiotics.

## 2017-10-04 NOTE — Progress Notes (Signed)
12 o'clock assessment not completed due to family leaning towards comfort care. I am allowing family to spend time with pt while they make their decision.

## 2017-10-04 NOTE — Progress Notes (Signed)
Pt's family has decided to make pt comfortable at this time. Currently awaiting more family to arrive before extubation.

## 2017-10-04 NOTE — Progress Notes (Signed)
ANTICOAGULATION CONSULT NOTE - Initial Consult  Pharmacy Consult for Heparin  Indication: chest pain/ACS  Allergies  Allergen Reactions  . Amitriptyline     sedating  . Erythromycin     unknown  . Lopressor [Metoprolol Tartrate]     Fatigue/malaise  . Macrolides And Ketolides     Macrolide antibiotics  . Pamelor [Nortriptyline]     Unknown   . Penicillins   . Sulfa Antibiotics   . Ciprocin-Fluocin-Procin [Fluocinolone] Rash  . Flagyl [Metronidazole] Rash    Patient Measurements: Height: 5\' 1"  (154.9 cm) Weight: 184 lb 8.4 oz (83.7 kg) IBW/kg (Calculated) : 47.8 Heparin Dosing Weight:   Vital Signs: Temp: 101.5 F (38.6 C) (09/02 0145) Temp Source: Core (09/02 0145) BP: 103/49 (09/02 0300) Pulse Rate: 100 (09/02 0300)  Labs: Recent Labs    09/17/2017 0018 09/16/2017 0134 09/06/2017 1129 09/16/2017 1832 09/12/2017 2036 09-22-2017 0256 09-22-2017 0302  HGB 13.6  --  12.0  --   --   --  10.4*  HCT 40.4  --  34.4*  --   --   --  30.5*  PLT 427  --  329  --   --   --  337  APTT  --   --  88*  --   --   --   --   LABPROT  --   --  14.0  --   --   --   --   INR  --   --  1.09  --   --   --   --   HEPARINUNFRC  --   --  0.71* 0.56  --  0.63  --   CREATININE 0.90  --  1.91*  --  1.67* 2.07*  --   TROPONINI 6.66* 9.17* 14.85*  --   --   --   --     Estimated Creatinine Clearance: 23.8 mL/min (A) (by C-G formula based on SCr of 2.07 mg/dL (H)).   Medical History: Past Medical History:  Diagnosis Date  . Anemia   . Carpal tunnel syndrome   . Cataract cortical, senile   . Esophageal reflux   . Essential hypertension   . History of chicken pox   . Hyperlipidemia   . Mini stroke ([place])   . Mixed hyperlipidemia   . Osteoarthrosis, not specified whether generalized/localized, lower leg   . Seasonal allergies     Medications:  Assessment: Pharmacy consulted for heparin drip dosing and monitoring in 72 yo female admitted with ACS/STEMI.  HEPARIN COURSE DATE   TIME    HL         CHANGE  9/1       0107    --          Initiate heparin infusion 4000 units IV x 1 bolus and 850 unit/hr rate 9/1       1129    0.71     No change due to line draw/difficult stick - recheck in 6 hours  9/1 1830 0.56 No change. Therapeutic x 1   Goal of Therapy:  Heparin level 0.3-0.7 units/ml Monitor platelets by anticoagulation protocol: Yes   Plan:  9/2:  HL @ 0300 = 0.63. Level now therapeutic x 2. Will continue pt on current rate of  850 units/hr.  Will recheck HL and CBC with AM labs on 9/3 per protocol.   Pernell Dupre, PharmD, BCPS Clinical Pharmacist 22-Sep-2017 3:52 AM

## 2017-10-04 NOTE — Progress Notes (Signed)
Pt is A&O x4 at this time. Pt states she doesn't want to be intubated nor does she want any chest compressions. She also states that she wants me to stop the vasopressors that are currently running. Notified [facility] of the situation. MD Oletta Darter changed patient to a partial code at this time due to patient being on BIPAP at this time.

## 2017-10-04 NOTE — Progress Notes (Signed)
Advanced Care Plan.  Purpose of Encounter: Code status. Parties in Attendance: The patient, her son and brother, RN and me. Patient's Decisional Capacity: No. Medical Story: Marilyn [name]  is a 72 y.o. female with a known history of obesity, HTN, HLD, TIA p/w acute SOB, hypoxemic respiratory failure. She is admitted for acute hypoxemic respiratory failure due to PNA, pulmonary edema, pleural effusion, NSTEMI, ARF, hypotension. She was intubated and on 3 vasopressors. I discussed with her son about her current critical condition, very poor prognosis, treatment options, code status and comfort care. He does not want her mother suffering and made DNR. He wants to continue current treatment for now. Goals of Care Determinations: continue current treatment for now, may get comfort care if failed treatment.. Plan:  Code Status: DNR. Time spent discussing advance care planning: 18 minutes.

## 2017-10-04 NOTE — Progress Notes (Signed)
Name: Marilyn [name] MRN: [id number] DOB: 1945/05/08     CONSULTATION DATE: 09/12/2017  Subjective & objective: Patient remains on a BiPAP, desaturation O2 sat 87%, worsening respiratory and mental status.  On heparin, phenylephrine and decreased urine output.  PAST MEDICAL HISTORY :   has a past medical history of Anemia, Carpal tunnel syndrome, Cataract cortical, senile, Esophageal reflux, Essential hypertension, History of chicken pox, Hyperlipidemia, Mini stroke ([place]), Mixed hyperlipidemia, Osteoarthrosis, not specified whether generalized/localized, lower leg, and Seasonal allergies.  has a past surgical history that includes Salpingoophorectomy; Total abdominal hysterectomy; Cataract extraction (Bilateral); Knee arthroscopy w/ partial medial meniscectomy (Left, 06/23/2004); Makoplasty (Left, 07/15/2011); Eye surgery; Colonoscopy with propofol (N/A, 11/27/2014); and Colonoscopy with propofol (N/A, 05/21/2016). Prior to Admission medications   Medication Sig Start Date End Date Taking? Authorizing Provider  amLODipine (NORVASC) 2.5 MG tablet Take 2.5 mg by mouth daily.   Yes [provider]  diphenhydramine-acetaminophen (TYLENOL PM) 25-500 MG TABS tablet Take 1 tablet by mouth at bedtime as needed.   Yes [provider]  esomeprazole (NEXIUM) 40 MG capsule Take 40 mg by mouth daily. 08/29/17  Yes [provider]  fluconazole (DIFLUCAN) 150 MG tablet Take 150 mg by mouth daily. 08/28/17  Yes [provider]  fluticasone (FLONASE) 50 MCG/ACT nasal spray Place into both nostrils daily.   Yes [provider]  LORazepam (ATIVAN) 0.5 MG tablet Take 1 tablet by mouth at bedtime as needed. 08/11/17  Yes [provider]  losartan (COZAAR) 50 MG tablet Take 50 mg by mouth 2 (two) times daily.   Yes [provider]  montelukast (SINGULAIR) 10 MG tablet Take 10 mg by mouth at bedtime.   Yes [provider]  Multiple Vitamins-Minerals  (MULTIVITAMIN ADULT PO) Take 1 tablet by mouth daily.    Yes [provider]  omeprazole (PRILOSEC) 40 MG capsule Take 40 mg by mouth daily. 08/07/17  Yes [provider]  traZODone (DESYREL) 100 MG tablet Take 100 mg by mouth at bedtime. 07/06/17  Yes [provider]   Allergies  Allergen Reactions  . Amitriptyline     sedating  . Erythromycin     unknown  . Lopressor [Metoprolol Tartrate]     Fatigue/malaise  . Macrolides And Ketolides     Macrolide antibiotics  . Pamelor [Nortriptyline]     Unknown   . Penicillins   . Sulfa Antibiotics   . Ciprocin-Fluocin-Procin [Fluocinolone] Rash  . Flagyl [Metronidazole] Rash    FAMILY HISTORY:  family history includes Breast cancer in her paternal aunt. SOCIAL HISTORY:  reports that she has quit smoking. She has never used smokeless tobacco. She reports that she does not drink alcohol or use drugs.  REVIEW OF SYSTEMS:   Unable to obtain due to critical illness   VITAL SIGNS: Temp:  [96.7 F (35.9 C)-103.2 F (39.6 C)] 98.6 F (37 C) (09/02 0800) Pulse Rate:  [48-123] 105 (09/02 1220) Resp:  [12-35] 23 (09/02 1220) BP: (51-115)/(27-91) 88/54 (09/02 1220) SpO2:  [72 %-98 %] 73 % (09/02 1220) FiO2 (%):  [30 %-100 %] 100 % (09/02 0846) Weight:  [83.7 kg] 83.7 kg (09/02 0313)  Physical Examination:  Obtunded with no motor neurological deficits Tolerating BiPAP, no distress, bilateral equal air entry with no adventitious sounds S1 and S2 are audible with no murmur Benign abdominal exam with normal peristalsis Mottled extremities and no peripheral edema.  Palpable PP   ASSESSMENT / PLAN: Acute respiratory failure, desaturation on BiPAP declining  mental status.  The son at the bedside agreed to intubation for short term no more than 5 days. Patient was excessively intubated (procedure note) -Monitor ABG, optimize vent settings.    NSTEMI cardiogenic shock with diffuse ST depression on EKG.  echocardiogram LVEF 50 to 55% inferior akinesis and hypokinesis lateral wall. -Heparin drip + beta-blocker as tolerated + aspirin -Hemodynamic monitoring -Management as per cardiology and plan for left heart cath when stable  Atelectasis and pneumonia.  Improved bibasilar airspace disease after intubation with a small right pleural effusion -Empiric Rocephin and doxy.  Monitor CXR + CBC + FiO2  Anion gap metabolic acidosis possible sepsis versus mild DKA with new onset diabetes -Optimize sodium bicarb, glycemic control -Monitor anion gap, lactic acid and ABG  AKI with ATN due to sepsis and renal hypoperfusion -Optimize hydration, hemodynamics, avoid nephrotoxins, monitor renal panel and urine output.  Mottling of extremities with sepsis and pressors -Optimize hemodynamics, wean off pressors as tolerated, topical nitroglycerin -Consider vascular consult if PP are not palpable  Anemia -Keep hemoglobin more than 7 g/dL  Hypomagnesemia -Replete and monitor electrolytes  Code status as verified with the son at the bedside to be DNR.  Continue with intubation and vent support.  No CPR and no defibrillation  Supportive care  *Patient condition continues to decline requiring pressor support.  Son met with cardiology at the bedside and he was updated. *Requested to withdraw support and start comfort measures. *Patient expired  Critical care time 45 minutes

## 2017-10-04 NOTE — Procedures (Signed)
Endotracheal Intubation Procedure Note  Indication for endotracheal intubation: respiratory failure. Airway Assessment: Mallampati Class: II (hard and soft palate, upper portion of tonsils anduvula visible). Sedation: etomidate. Paralytic: none. Lidocaine: no. Atropine: no. Equipment: Glide scope. Cricoid Pressure: yes. Number of attempts: 1. ETT location confirmed by by auscultation, by CXR and ETCO2 monitor.  Marilyn [name] 2017-09-30

## 2017-10-04 NOTE — Progress Notes (Signed)
[place] [facility] [place] NAME: Marilyn [name]    MR#:  [id number]  DATE OF BIRTH:  06/16/1945  SUBJECTIVE:  CHIEF COMPLAINT:   Chief Complaint  Patient presents with  . Respiratory Distress   The patient was intubated this am. Hypotension, on 3 vasopressors. REVIEW OF SYSTEMS:  Review of Systems  Unable to perform ROS: Intubated    DRUG ALLERGIES:   Allergies  Allergen Reactions  . Amitriptyline     sedating  . Erythromycin     unknown  . Lopressor [Metoprolol Tartrate]     Fatigue/malaise  . Macrolides And Ketolides     Macrolide antibiotics  . Pamelor [Nortriptyline]     Unknown   . Penicillins   . Sulfa Antibiotics   . Ciprocin-Fluocin-Procin [Fluocinolone] Rash  . Flagyl [Metronidazole] Rash   VITALS:  Blood pressure (!) 94/50, pulse 93, temperature (!) 96.7 F (35.9 C), temperature source Core, resp. rate 17, height 5\' 1"  (1.549 m), weight 83.7 kg, SpO2 94 %. PHYSICAL EXAMINATION:  Physical Exam  Constitutional: She appears well-developed.  HENT:  Head: Normocephalic.  Eyes: Pupils are equal, round, and reactive to light. Conjunctivae are normal. No scleral icterus.  Neck: Neck supple. No JVD present. No tracheal deviation present.  Cardiovascular: Regular rhythm and normal heart sounds. Exam reveals no gallop.  No murmur heard. tachycardia  Pulmonary/Chest: No respiratory distress. She has no wheezes. She has rales.  Abdominal: Soft. Bowel sounds are normal. She exhibits no distension. There is no tenderness. There is no rebound.  Musculoskeletal: She exhibits no edema.  Neurological:  intubated  Skin: No rash noted. No erythema.   LABORATORY PANEL:  Female CBC Recent Labs  Lab September 19, 2017 0302  WBC 24.7*  HGB 10.4*  HCT 30.5*  PLT 337   ------------------------------------------------------------------------------------------------------------------ Chemistries  Recent Labs  Lab 09/24/2017 0018   09-19-17 0256 09/19/2017 0302  NA 129*   < > 137  --   K 5.0   < > 3.6  --   CL 94*   < > 106  --   CO2 19*   < > 20*  --   GLUCOSE 244*   < > 175*  --   BUN 20   < > 29*  --   CREATININE 0.90   < > 2.07*  --   CALCIUM 9.1   < > 7.0*  --   MG  --   --   --  1.5*  AST 126*  --   --   --   ALT 31  --   --   --   ALKPHOS 63  --   --   --   BILITOT 1.5*  --   --   --    < > = values in this interval not displayed.   RADIOLOGY:  Dg Chest Port 1 View  Result Date: 09/19/2017 CLINICAL DATA:  Intubation EXAM: PORTABLE CHEST 1 VIEW COMPARISON:  09-19-2017 FINDINGS: Endotracheal tube 4 cm above the carina. NG tube enters the stomach with the tip not visualized. Right PICC line tip upper SVC level as before. Slight improvement in right upper lobe, left hilar, and left lower lobe airspace disease/consolidation. Stable heart size and vascularity. No enlarging effusion or pneumothorax. Trachea is midline. Aorta is atherosclerotic. IMPRESSION: Slight improvement in asymmetric bilateral airspace process compared to yesterday remain nonspecific for multilobar pneumonia versus alveolar edema. Stable small pleural effusions No pneumothorax Electronically Signed   By: Jerilynn Mages.  Shick M.D.   On: 2017-09-10 09:27   Dg Chest Port 1 View  Result Date: 09-10-2017 CLINICAL DATA:  Pneumonia EXAM: PORTABLE CHEST 1 VIEW COMPARISON:  09/17/2017 FINDINGS: Slight worsening asymmetric airspace process throughout the right lung but also the left hilum and left lower lobe compatible with multilobar pneumonia and/or asymmetric edema. Heart is enlarged. Small pleural effusions noted as before. No pneumothorax. Trachea is midline. Aorta is atherosclerotic. Right PICC line tip mid SVC level. IMPRESSION: Slight worsening asymmetric airspace process compatible with multilobar pneumonia versus asymmetric edema. Persistent small effusions Electronically Signed   By: Jerilynn Mages.  Shick M.D.   On: 09-10-17 08:42   Dg Abd Portable 1v  Result Date:  10-Sep-2017 CLINICAL DATA:  OG tube placement EXAM: PORTABLE ABDOMEN - 1 VIEW COMPARISON:  September 10, 2017 FINDINGS: Endotracheal tube is in place. Partially imaged RIGHT PICC line, tip to the superior vena cava. An orogastric tube has been placed, tip overlying the level of the stomach. There is persistent significant patchy infiltrate within the lungs bilaterally, more confluent at the LEFT lung base. Bilateral pleural effusions are present. IMPRESSION: Placement of orogastric tube to the level of the stomach. Electronically Signed   By: Nolon Nations M.D.   On: 09/10/17 09:23   ASSESSMENT AND PLAN:   Acute hypoxemic respiratory failure due to PNA, pulmonary edema, pleural effusions. Continue ventilation, follow up intensivist.  NSTEMI, troponin up to 14, on heparin drip..  Sepsis due to  PNA, continue Abx, follow up CBC and blood culture.  Hypotension. Continue vasopressor drip. On 3 vasopressors with max dose.  ARF due to above. Continue bicarb infusion. Follow up BMP.  Hypomagnesemia. IV mag.  Discussed with Dr. Ubaldo Glassing.  Very poor prognosis. Her son made decision to DNR. All the records are reviewed and case discussed with Care Management/Social Worker. Management plans discussed with the patient's son and borther and they are in agreement.  CODE STATUS: DNR  TOTAL TIME TAKING CARE OF THIS PATIENT: 42 minutes.   More than 50% of the time was spent in counseling/coordination of care: YES  POSSIBLE D/C IN ? DAYS, DEPENDING ON CLINICAL CONDITION.   Demetrios Loll M.D on 2017/09/10 at 10:37 AM  Between 7am to 6pm - Pager - [phone]  After 6pm go to [email or web address] - [facility]

## 2017-10-04 NOTE — Death Summary Note (Signed)
DEATH SUMMARY   THIS WAS COMPLETED FOR [facility]   Patient Details  Name: Marilyn [name] MRN: [id number] DOB: [birth year]  Admission/Discharge Information   Admit Date:  09-14-2017  Date of Death: Date of Death: 2017/09/15  Time of Death: Time of Death: 02-23-11  Length of Stay: 1  Referring Physician: Baxter Hire, MD   Reason(s) for Hospitalization  Acute respiratory failure, desaturation on BiPAP declining mental status.  The son at the bedside agreed to intubation for short term no more than 5 days. Patient was excessively intubated (procedure note) -Monitor ABG, optimize vent settings.   NSTEMI cardiogenic shock with diffuse ST depression on EKG. echocardiogram LVEF 50 to 55% inferior akinesis and hypokinesis lateral wall. -Heparin drip + beta-blocker as tolerated + aspirin -Hemodynamic monitoring -Management as per cardiology and plan for left heart cath when stable  Atelectasis and pneumonia.  Improvedbibasilar airspace disease after intubation with a small right pleural effusion -Empiric Rocephin and doxy. Monitor CXR + CBC + FiO2  Anion gap metabolic acidosis possible sepsis versus mild DKA with new onset diabetes -Optimize sodium bicarb, glycemic control -Monitor anion gap, lactic acid and ABG  AKI with ATN due to sepsis and renal hypoperfusion -Optimize hydration, hemodynamics, avoid nephrotoxins, monitor renal panel and urine output.  Mottling of extremities with sepsis and pressors -Optimize hemodynamics, wean off pressors as tolerated, topical nitroglycerin -Consider vascular consult if PP are not palpable  Anemia -Keep hemoglobin more than 7 g/dL  Hypomagnesemia -Replete and monitor electrolytes  Code status as verified with the son at the bedside to be DNR.  Continue with intubation and vent support.  No CPR and no defibrillation  Supportive care  *Patient condition continues to decline requiring pressor support.  Son met with cardiology at  the bedside and he was updated. *Requested to withdraw support and start comfort measures. *Patient expired   Diagnoses  Preliminary cause of death: NSTEMI, PNEUMONIA Secondary Diagnoses (including complications and co-morbidities):  Active Problems:   Acute hypoxemic respiratory failure (HCC)      Pertinent Labs and Studies  Significant Diagnostic Studies Dg Chest Port 1 View  Result Date: 15-Sep-2017 CLINICAL DATA:  Intubation EXAM: PORTABLE CHEST 1 VIEW COMPARISON:  September 15, 2017 FINDINGS: Endotracheal tube 4 cm above the carina. NG tube enters the stomach with the tip not visualized. Right PICC line tip upper SVC level as before. Slight improvement in right upper lobe, left hilar, and left lower lobe airspace disease/consolidation. Stable heart size and vascularity. No enlarging effusion or pneumothorax. Trachea is midline. Aorta is atherosclerotic. IMPRESSION: Slight improvement in asymmetric bilateral airspace process compared to yesterday remain nonspecific for multilobar pneumonia versus alveolar edema. Stable small pleural effusions No pneumothorax Electronically Signed   By: Jerilynn Mages.  Shick M.D.   On: Sep 15, 2017 09:27   Dg Chest Port 1 View  Result Date: 09-15-2017 CLINICAL DATA:  Pneumonia EXAM: PORTABLE CHEST 1 VIEW COMPARISON:  September 14, 2017 FINDINGS: Slight worsening asymmetric airspace process throughout the right lung but also the left hilum and left lower lobe compatible with multilobar pneumonia and/or asymmetric edema. Heart is enlarged. Small pleural effusions noted as before. No pneumothorax. Trachea is midline. Aorta is atherosclerotic. Right PICC line tip mid SVC level. IMPRESSION: Slight worsening asymmetric airspace process compatible with multilobar pneumonia versus asymmetric edema. Persistent small effusions Electronically Signed   By: Jerilynn Mages.  Shick M.D.   On: 09/15/17 08:42   Dg Chest Port 1 View  Result Date: Sep 14, 2017 CLINICAL DATA:  72 year old female with respiratory  distress  and shortness of breath. EXAM: PORTABLE CHEST 1 VIEW COMPARISON:  None. FINDINGS: There is diffuse interstitial and central vascular prominence consistent with vascular congestion. Trace bilateral pleural effusions noted. Superimposed pneumonia is not excluded. No pneumothorax. The cardiac silhouette is within normal limits. No acute osseous pathology. IMPRESSION: Interstitial edema with trace bilateral pleural effusions. Findings may represent noncardiogenic pulmonary edema. Clinical correlation is recommended. Electronically Signed   By: Anner Crete M.D.   On: 09/25/2017 00:51   Dg Abd Portable 1v  Result Date: 25-Sep-2017 CLINICAL DATA:  OG tube placement EXAM: PORTABLE ABDOMEN - 1 VIEW COMPARISON:  September 25, 2017 FINDINGS: Endotracheal tube is in place. Partially imaged RIGHT PICC line, tip to the superior vena cava. An orogastric tube has been placed, tip overlying the level of the stomach. There is persistent significant patchy infiltrate within the lungs bilaterally, more confluent at the LEFT lung base. Bilateral pleural effusions are present. IMPRESSION: Placement of orogastric tube to the level of the stomach. Electronically Signed   By: Nolon Nations M.D.   On: 09-25-2017 09:23   [place] Ekg Site Rite  Result Date: 09/09/2017 If Site Rite image not attached, placement could not be confirmed due to current cardiac rhythm.   Microbiology Recent Results (from the past 240 hour(s))  MRSA PCR Screening     Status: None   Collection Time: 09/08/2017  2:20 AM  Result Value Ref Range Status   MRSA by PCR NEGATIVE NEGATIVE Final    Comment:        The GeneXpert MRSA Assay (FDA approved for NASAL specimens only), is one component of a comprehensive MRSA colonization surveillance program. It is not intended to diagnose MRSA infection nor to guide or monitor treatment for MRSA infections. Performed at [facility], [address] [place], [place] [address]   Culture, blood  (routine x 2) Call MD if unable to obtain prior to antibiotics being given     Status: None (Preliminary result)   Collection Time: 09/20/2017  4:29 AM  Result Value Ref Range Status   Specimen Description BLOOD BLOOD LEFT FOREARM  Final   Special Requests   Final    BOTTLES DRAWN AEROBIC AND ANAEROBIC Blood Culture results may not be optimal due to an inadequate volume of blood received in culture bottles   Culture   Final    NO GROWTH 4 DAYS Performed at [facility], [address] [place], [place] [address]    Report Status PENDING  Incomplete  Culture, blood (routine x 2) Call MD if unable to obtain prior to antibiotics being given     Status: None (Preliminary result)   Collection Time: 09/17/2017  4:29 AM  Result Value Ref Range Status   Specimen Description BLOOD BLOOD LEFT HAND  Final   Special Requests   Final    BOTTLES DRAWN AEROBIC ONLY Blood Culture results may not be optimal due to an inadequate volume of blood received in culture bottles   Culture   Final    NO GROWTH 4 DAYS Performed at [facility], [address] [place], [place] [address]    Report Status PENDING  Incomplete    Lab Basic Metabolic Panel: Recent Labs  Lab 10/02/2017 0018 09/09/2017 1129 09/28/2017 2036 09/25/17 0256 09-25-2017 0302  NA 129* 133* 135 137  --   K 5.0 4.2 3.9 3.6  --   CL 94* 96* 101 106  --   CO2 19* 24 24 20*  --   GLUCOSE  244* 152* 148* 175*  --   BUN 20 26* 28* 29*  --   CREATININE 0.90 1.91* 1.67* 2.07*  --   CALCIUM 9.1 8.5* 7.9* 7.0*  --   MG  --   --   --   --  1.5*  PHOS  --   --   --   --  5.5*   Liver Function Tests: Recent Labs  Lab 09/16/2017 0018  AST 126*  ALT 31  ALKPHOS 63  BILITOT 1.5*  PROT 7.2  ALBUMIN 4.0   No results for input(s): LIPASE, AMYLASE in the last 168 hours. No results for input(s): AMMONIA in the last 168 hours. CBC: Recent Labs  Lab 09/24/2017 0018 09/30/2017 1129 09/28/2017 0302  WBC 25.5* 14.8* 24.7*  NEUTROABS  18.2*  --  19.8*  HGB 13.6 12.0 10.4*  HCT 40.4 34.4* 30.5*  MCV 89.2 88.0 90.6  PLT 427 329 337   Cardiac Enzymes: Recent Labs  Lab 09/06/2017 0018 09/30/2017 0134 09/06/2017 1129  TROPONINI 6.66* 9.17* 14.85*   Sepsis Labs: Recent Labs  Lab 09/11/2017 0018 09/07/2017 1129 09/16/2017 1155 09/12/2017 1400 2017-09-28 0256 2017-09-28 0302  PROCALCITON  --  0.44  --   --  0.33  --   WBC 25.5* 14.8*  --   --   --  24.7*  LATICACIDVEN  --   --  1.0 0.8  --   --       Maretta Bees Cai Flott 09/08/2017, 10:55 AM

## 2017-10-04 DEATH — deceased
